# Patient Record
Sex: Female | Born: 1965 | Race: White | Hispanic: No | Smoking: Former smoker
Health system: Southern US, Community
[De-identification: ages and names within clinical notes are randomized; demographics above are authoritative.]

## PROBLEM LIST (undated history)

## (undated) DIAGNOSIS — D649 Anemia, unspecified: Secondary | ICD-10-CM

## (undated) DIAGNOSIS — E049 Nontoxic goiter, unspecified: Secondary | ICD-10-CM

## (undated) DIAGNOSIS — C55 Malignant neoplasm of uterus, part unspecified: Secondary | ICD-10-CM

## (undated) DIAGNOSIS — D696 Thrombocytopenia, unspecified: Secondary | ICD-10-CM

## (undated) HISTORY — PX: CHOLECYSTECTOMY: SHX55

## (undated) HISTORY — DX: Malignant neoplasm of uterus, part unspecified: C55

---

## 2000-08-03 ENCOUNTER — Emergency Department (HOSPITAL_COMMUNITY): Admission: EM | Admit: 2000-08-03 | Discharge: 2000-08-03 | Payer: Self-pay | Admitting: Emergency Medicine

## 2000-08-25 ENCOUNTER — Observation Stay (HOSPITAL_COMMUNITY): Admission: RE | Admit: 2000-08-25 | Discharge: 2000-08-26 | Payer: Self-pay | Admitting: *Deleted

## 2001-05-31 ENCOUNTER — Other Ambulatory Visit: Admission: RE | Admit: 2001-05-31 | Discharge: 2001-05-31 | Payer: Self-pay | Admitting: Obstetrics and Gynecology

## 2001-06-03 ENCOUNTER — Encounter: Admission: RE | Admit: 2001-06-03 | Discharge: 2001-06-03 | Payer: Self-pay | Admitting: Obstetrics and Gynecology

## 2001-06-03 ENCOUNTER — Encounter: Payer: Self-pay | Admitting: Obstetrics and Gynecology

## 2003-04-01 ENCOUNTER — Emergency Department (HOSPITAL_COMMUNITY): Admission: EM | Admit: 2003-04-01 | Discharge: 2003-04-01 | Payer: Self-pay | Admitting: Emergency Medicine

## 2013-07-20 ENCOUNTER — Encounter (HOSPITAL_COMMUNITY): Payer: Self-pay | Admitting: Emergency Medicine

## 2013-07-20 ENCOUNTER — Emergency Department (INDEPENDENT_AMBULATORY_CARE_PROVIDER_SITE_OTHER)
Admission: EM | Admit: 2013-07-20 | Discharge: 2013-07-20 | Disposition: A | Discharge status: Home / Self Care | Payer: Self-pay | Source: Home / Self Care | Attending: Emergency Medicine | Admitting: Emergency Medicine

## 2013-07-20 ENCOUNTER — Emergency Department (INDEPENDENT_AMBULATORY_CARE_PROVIDER_SITE_OTHER): Payer: Self-pay

## 2013-07-20 DIAGNOSIS — J209 Acute bronchitis, unspecified: Secondary | ICD-10-CM

## 2013-07-20 DIAGNOSIS — E049 Nontoxic goiter, unspecified: Secondary | ICD-10-CM

## 2013-07-20 DIAGNOSIS — J019 Acute sinusitis, unspecified: Secondary | ICD-10-CM

## 2013-07-20 MED ORDER — BENZONATATE 200 MG PO CAPS: 200.0000 mg | ORAL_CAPSULE | Freq: Three times a day (TID) | ORAL | Status: DC | PRN

## 2013-07-20 MED ORDER — IPRATROPIUM BROMIDE 0.06 % NA SOLN: 2.0000 | Freq: Four times a day (QID) | NASAL | Status: DC

## 2013-07-20 MED ORDER — AZITHROMYCIN 250 MG PO TABS: ORAL_TABLET | ORAL | Status: DC

## 2013-07-20 NOTE — ED Provider Notes (Signed)
Chief Complaint   Chief Complaint  Patient presents with  . URI    History of Present Illness   Sharon Hensley is a 48 year old female has had a one-week history of a cough productive of brown to green sputum, wheezing, chest pain, nasal congestion with greenish drainage, headache, sinus pressure, and ear pressure. She's also had poor appetite, fatigue, has to sleep sitting up, had subjective fever, stomach cramps, and constipation. She denies any sick exposures.  Review of Systems   Other than as noted above, the patient denies any of the following symptoms: Systemic:  No fevers, chills, sweats, or myalgias. Eye:  No redness or discharge. ENT:  No ear pain, headache, nasal congestion, drainage, sinus pressure, or sore throat. Neck:  No neck pain, stiffness, or swollen glands. Lungs:  No cough, sputum production, hemoptysis, wheezing, chest tightness, shortness of breath or chest pain. GI:  No abdominal pain, nausea, vomiting or diarrhea.  [name]   Past medical history, family history, social history, meds, and allergies were reviewed. She is allergic to penicillins.  Physical exam   Vital signs:  LMP 06/19/2013 General:  Alert and oriented.  In no distress.  Skin warm and dry. Eye:  No conjunctival injection or drainage. Lids were normal. ENT:  TMs and canals were normal, without erythema or inflammation.  Nasal mucosa was clear and uncongested, without drainage.  Mucous membranes were moist.  Pharynx was clear with no exudate or drainage.  There were no oral ulcerations or lesions. Neck:  Supple, no adenopathy, tenderness or mass. Her thyroid was diffusely enlarged. Lungs:  No respiratory distress.  Lungs were clear to auscultation, without wheezes, rales or rhonchi.  Breath sounds were clear and equal bilaterally.  Heart:  Regular rhythm, without gallops, murmers or rubs. Skin:  Clear, warm, and dry, without rash or lesions.  Radiology   Dg Chest 2 View  07/20/2013   CLINICAL  DATA:  Productive cough; shortness of breath  EXAM: CHEST  2 VIEW  COMPARISON:  None.  FINDINGS: There is no edema or consolidation. The heart size and pulmonary vascularity are normal. No adenopathy. No bone lesions.  IMPRESSION: No edema or consolidation.   Electronically Signed   By: Lowella Grip M.D.   On: 07/20/2013 12:46   Assessment     The primary encounter diagnosis was Acute sinusitis. Diagnoses of Acute bronchitis and Goiter were also pertinent to this visit.  She'll need followup on the goiter.  Plan    1.  Meds:  The following meds were prescribed:   Discharge Medication List as of 07/20/2013  1:14 PM    START taking these medications   Details  azithromycin (ZITHROMAX Z-PAK) 250 MG tablet Take as directed., Normal    benzonatate (TESSALON) 200 MG capsule Take 1 capsule (200 mg total) by mouth 3 (three) times daily as needed for cough., Starting 07/20/2013, Until Discontinued, Normal    ipratropium (ATROVENT) 0.06 % nasal spray Place 2 sprays into both nostrils 4 (four) times daily., Starting 07/20/2013, Until Discontinued, Normal        2.  Patient Education/Counseling:  The patient was given appropriate handouts, self care instructions, and instructed in symptomatic relief.  Instructed to get extra fluids, rest, and use a cool mist vaporizer.    3.  Follow up:  The patient was told to follow up here if no better in 3 to 4 days, or sooner if becoming worse in any way, and given some red flag symptoms such as increasing  fever, difficulty breathing, chest pain, or persistent vomiting which would prompt immediate return.  Follow up with Dr. Delrae Rend for the goiter.      Harden Mo, MD 07/20/13 2128

## 2013-07-20 NOTE — ED Notes (Signed)
C/o  Sob.  Chest congestion and soreness.  Productive cough with brown/yellow sputum.  Cough is worse at night.   Post nasal drip.   Fatigue.  States falling alseep at inappropriate times.   Symptoms present x 1 wk.    Denies fever and diarrhea.   No relief with otc meds.

## 2013-07-20 NOTE — Discharge Instructions (Signed)
Acute Bronchitis Bronchitis is inflammation of the airways that extend from the windpipe into the lungs (bronchi). The inflammation often causes mucus to develop. This leads to a cough, which is the most common symptom of bronchitis.  In acute bronchitis, the condition usually develops suddenly and goes away over time, usually in a couple weeks. Smoking, allergies, and asthma can make bronchitis worse. Repeated episodes of bronchitis may cause further lung problems.  CAUSES Acute bronchitis is most often caused by the same virus that causes a cold. The virus can spread from person to person (contagious).  SIGNS AND SYMPTOMS   Cough.   Fever.   Coughing up mucus.   Body aches.   Chest congestion.   Chills.   Shortness of breath.   Sore throat.  DIAGNOSIS  Acute bronchitis is usually diagnosed through a physical exam. Tests, such as chest X-rays, are sometimes done to rule out other conditions.  TREATMENT  Acute bronchitis usually goes away in a couple weeks. Often times, no medical treatment is necessary. Medicines are sometimes given for relief of fever or cough. Antibiotics are usually not needed but may be prescribed in certain situations. In some cases, an inhaler may be recommended to help reduce shortness of breath and control the cough. A cool mist vaporizer may also be used to help thin bronchial secretions and make it easier to clear the chest.  HOME CARE INSTRUCTIONS  Get plenty of rest.   Drink enough fluids to keep your urine clear or pale yellow (unless you have a medical condition that requires fluid restriction). Increasing fluids may help thin your secretions and will prevent dehydration.   Only take over-the-counter or prescription medicines as directed by your health care provider.   Avoid smoking and secondhand smoke. Exposure to cigarette smoke or irritating chemicals will make bronchitis worse. If you are a smoker, consider using nicotine gum or skin  patches to help control withdrawal symptoms. Quitting smoking will help your lungs heal faster.   Reduce the chances of another bout of acute bronchitis by washing your hands frequently, avoiding people with cold symptoms, and trying not to touch your hands to your mouth, nose, or eyes.   Follow up with your health care provider as directed.  SEEK MEDICAL CARE IF: Your symptoms do not improve after 1 week of treatment.  SEEK IMMEDIATE MEDICAL CARE IF:  You develop an increased fever or chills.   You have chest pain.   You have severe shortness of breath.  You have bloody sputum.   You develop dehydration.  You develop fainting.  You develop repeated vomiting.  You develop a severe headache. MAKE SURE YOU:   Understand these instructions.  Will watch your condition.  Will get help right away if you are not doing well or get worse. Document Released: 04/24/2004 Document Revised: 11/17/2012 Document Reviewed: 09/07/2012 [facility] Patient Information 2014 [place].  Goiter Goiter is an enlarged thyroid gland. The thyroid gland sits at the base of the front of the neck. The gland produces hormones that regulate mood, body temperature, pulse rate, and digestion. Most goiters are painless and are not a cause for serious concern. Goiters and conditions that cause goiters can be treated if necessary.  CAUSES  Common causes of goiter include:  Graves disease (causes too much hormone to be produced [hyperthyroidism]).  Hashimoto's disease (causes too little hormone to be produced [hypothyroidism]).  Thyroiditis (inflammation of the thyroid sometimes caused by virus or pregnancy).  Nodular goiter (small bumps  form; sometimes called toxic nodular goiter).  Pregnancy.  Thyroid cancer (very few goiters with nodules are cancerous).  Certain medications.  Radiation exposure.  Iodine deficiency (more common in developing countries in inland populations). RISK  FACTORS Risk factors for goiter include:  A family history of goiter.  Female gender.  Inadequate iodine in the diet.  Age older than 24 years. SYMPTOMS  Many goiters do not cause symptoms. When symptoms do occur, they may include:  Swelling in the lower part of the neck. This swelling can range from a very small bump to a large lump.  A tight feeling in the throat.  A hoarse voice. Less commonly, a goiter may result in:  Coughing.  Wheezing.  Difficulty swallowing.  Difficulty breathing.  Bulging neck veins.  Dizziness. When a goiter is the result of hyperthyroidism, symptoms may include:  Rapid or irregular heart beat.  Sicknessin your stomach (nausea).  Vomiting.  Diarrhea.  Shaking.  Irritable feeling.  Bulging eyes.  Weight loss.  Heat sensitivity.  Anxiety. When a goiter is the result of hypothyroidism, symptoms may include:  Tiredness.  Dry skin.  Constipation.  Weight gain.  Irregular menstrual cycle.  Depressed mood.  Sensitivity to cold. DIAGNOSIS  Tests used to diagnose goiter include:  A physical exam.  Blood tests, including thyroid hormone levels and antibody testing.  Ultrasonography, computerized X-ray scan (computed tomography, CT) or computerized magnetic scan (magnetic resonance imaging, MRI).  Thyroid scan (imaging along with safe radioactive injection).  Tissue sample taken (biopsy) of nodules. This is sometimes done to confirm that the nodules are not cancerous. TREATMENT  Treatment will depend on the cause of the goiter. Treatment may include:  Monitoring. In some cases, no treatment is necessary, and your doctor will monitor yourcondition at regular check ups.  Medications and supplements. Thyroid medication (thyroid hormone replacement) is available for hyperthroidism and hypothyroidism.  If inflammation is the cause, over-the-counter medication or steroid medication may be recommended.  Goiters caused by  iodine deficiency can be treated with iodine supplements or changes in diet.  Radioactive iodine treatment. Radioactive iodine is injected into the blood. It travels to the thyroid gland, kills thyroid cells, and reduces the size of the gland. This is only used when the thyroid gland is overactive. Lifelong thyroid hormone medication is often necessary after this treatment.  Surgery. A procedure to remove all or part of the gland may be recommended in severe cases or when cancer is the cause. Hormones can be taken to replace the hormones normally produced by the thyroid. HOME CARE INSTRUCTIONS   Take medications as directed.  Follow your caregiver's recommendations for any dietary changes.  Follow up with your caregiver for further examination and testing, as directed. PREVENTION   If you have a family history of goiter, discuss screening with your doctor.  Make sure you are getting enough iodine in your diet.  Use of iodized table salt can help prevent iodine deficiency. Document Released: 09/04/2009 Document Revised: 06/09/2011 Document Reviewed: 09/04/2009 [facility] Patient Information 2014 [place].  Sinusitis Sinusitis is redness, soreness, and swelling (inflammation) of the paranasal sinuses. Paranasal sinuses are air pockets within the bones of your face (beneath the eyes, the middle of the forehead, or above the eyes). In healthy paranasal sinuses, mucus is able to drain out, and air is able to circulate through them by way of your nose. However, when your paranasal sinuses are inflamed, mucus and air can become trapped. This can allow bacteria and other  germs to grow and cause infection. Sinusitis can develop quickly and last only a short time (acute) or continue over a long period (chronic). Sinusitis that lasts for more than 12 weeks is considered chronic.  CAUSES  Causes of sinusitis include:  Allergies.  Structural abnormalities, such as displacement of the cartilage  that separates your nostrils (deviated septum), which can decrease the air flow through your nose and sinuses and affect sinus drainage.  Functional abnormalities, such as when the small hairs (cilia) that line your sinuses and help remove mucus do not work properly or are not present. SYMPTOMS  Symptoms of acute and chronic sinusitis are the same. The primary symptoms are pain and pressure around the affected sinuses. Other symptoms include:  Upper toothache.  Earache.  Headache.  Bad breath.  Decreased sense of smell and taste.  A cough, which worsens when you are lying flat.  Fatigue.  Fever.  Thick drainage from your nose, which often is green and may contain pus (purulent).  Swelling and warmth over the affected sinuses. DIAGNOSIS  Your caregiver will perform a physical exam. During the exam, your caregiver may:  Look in your nose for signs of abnormal growths in your nostrils (nasal polyps).  Tap over the affected sinus to check for signs of infection.  View the inside of your sinuses (endoscopy) with a special imaging device with a light attached (endoscope), which is inserted into your sinuses. If your caregiver suspects that you have chronic sinusitis, one or more of the following tests may be recommended:  Allergy tests.  Nasal culture A sample of mucus is taken from your nose and sent to a lab and screened for bacteria.  Nasal cytology A sample of mucus is taken from your nose and examined by your caregiver to determine if your sinusitis is related to an allergy. TREATMENT  Most cases of acute sinusitis are related to a viral infection and will resolve on their own within 10 days. Sometimes medicines are prescribed to help relieve symptoms (pain medicine, decongestants, nasal steroid sprays, or saline sprays).  However, for sinusitis related to a bacterial infection, your caregiver will prescribe antibiotic medicines. These are medicines that will help kill the  bacteria causing the infection.  Rarely, sinusitis is caused by a fungal infection. In theses cases, your caregiver will prescribe antifungal medicine. For some cases of chronic sinusitis, surgery is needed. Generally, these are cases in which sinusitis recurs more than 3 times per year, despite other treatments. HOME CARE INSTRUCTIONS   Drink plenty of water. Water helps thin the mucus so your sinuses can drain more easily.  Use a humidifier.  Inhale steam 3 to 4 times a day (for example, sit in the bathroom with the shower running).  Apply a warm, moist washcloth to your face 3 to 4 times a day, or as directed by your caregiver.  Use saline nasal sprays to help moisten and clean your sinuses.  Take over-the-counter or prescription medicines for pain, discomfort, or fever only as directed by your caregiver. SEEK IMMEDIATE MEDICAL CARE IF:  You have increasing pain or severe headaches.  You have nausea, vomiting, or drowsiness.  You have swelling around your face.  You have vision problems.  You have a stiff neck.  You have difficulty breathing. MAKE SURE YOU:   Understand these instructions.  Will watch your condition.  Will get help right away if you are not doing well or get worse. Document Released: 03/17/2005 Document Revised: 06/09/2011 Document Reviewed:  04/01/2011 [employer] Patient Information 2014 [place].

## 2013-07-27 NOTE — ED Notes (Signed)
Chart review.

## 2013-08-08 ENCOUNTER — Encounter: Payer: Self-pay | Admitting: Internal Medicine

## 2013-08-12 ENCOUNTER — Emergency Department (HOSPITAL_COMMUNITY): Payer: Self-pay

## 2013-08-12 ENCOUNTER — Encounter (HOSPITAL_COMMUNITY): Payer: Self-pay | Admitting: Emergency Medicine

## 2013-08-12 ENCOUNTER — Emergency Department (HOSPITAL_COMMUNITY)
Admission: EM | Admit: 2013-08-12 | Discharge: 2013-08-12 | Disposition: A | Discharge status: Home / Self Care | Payer: Self-pay | Attending: Emergency Medicine | Admitting: Emergency Medicine

## 2013-08-12 DIAGNOSIS — R609 Edema, unspecified: Secondary | ICD-10-CM | POA: Insufficient documentation

## 2013-08-12 DIAGNOSIS — D649 Anemia, unspecified: Secondary | ICD-10-CM | POA: Insufficient documentation

## 2013-08-12 DIAGNOSIS — J039 Acute tonsillitis, unspecified: Secondary | ICD-10-CM

## 2013-08-12 DIAGNOSIS — H6690 Otitis media, unspecified, unspecified ear: Secondary | ICD-10-CM

## 2013-08-12 DIAGNOSIS — Z87891 Personal history of nicotine dependence: Secondary | ICD-10-CM | POA: Insufficient documentation

## 2013-08-12 DIAGNOSIS — IMO0002 Reserved for concepts with insufficient information to code with codable children: Secondary | ICD-10-CM | POA: Insufficient documentation

## 2013-08-12 DIAGNOSIS — Z880 Allergy status to penicillin: Secondary | ICD-10-CM | POA: Insufficient documentation

## 2013-08-12 DIAGNOSIS — Z79899 Other long term (current) drug therapy: Secondary | ICD-10-CM | POA: Insufficient documentation

## 2013-08-12 DIAGNOSIS — Z791 Long term (current) use of non-steroidal anti-inflammatories (NSAID): Secondary | ICD-10-CM | POA: Insufficient documentation

## 2013-08-12 DIAGNOSIS — E049 Nontoxic goiter, unspecified: Secondary | ICD-10-CM | POA: Insufficient documentation

## 2013-08-12 DIAGNOSIS — Z792 Long term (current) use of antibiotics: Secondary | ICD-10-CM | POA: Insufficient documentation

## 2013-08-12 HISTORY — DX: Nontoxic goiter, unspecified: E04.9

## 2013-08-12 HISTORY — DX: Anemia, unspecified: D64.9

## 2013-08-12 HISTORY — DX: Thrombocytopenia, unspecified: D69.6

## 2013-08-12 LAB — COMPREHENSIVE METABOLIC PANEL
ALT: 61 U/L — ABNORMAL HIGH (ref 0–35)
AST: 94 U/L — AB (ref 0–37)
Albumin: 4.4 g/dL (ref 3.5–5.2)
Alkaline Phosphatase: 71 U/L (ref 39–117)
BILIRUBIN TOTAL: 0.2 mg/dL — AB (ref 0.3–1.2)
BUN: 9 mg/dL (ref 6–23)
CHLORIDE: 94 meq/L — AB (ref 96–112)
CO2: 31 mEq/L (ref 19–32)
CREATININE: 1.01 mg/dL (ref 0.50–1.10)
Calcium: 11.3 mg/dL — ABNORMAL HIGH (ref 8.4–10.5)
GFR calc Af Amer: 76 mL/min — ABNORMAL LOW (ref >90)
GFR calc non Af Amer: 65 mL/min — ABNORMAL LOW (ref >90)
Glucose, Bld: 90 mg/dL (ref 70–99)
Potassium: 3.7 mEq/L (ref 3.7–5.3)
Sodium: 137 mEq/L (ref 137–147)
Total Protein: 8.4 g/dL — ABNORMAL HIGH (ref 6.0–8.3)

## 2013-08-12 LAB — CBC WITH DIFFERENTIAL/PLATELET
Basophils Absolute: 0 10*3/uL (ref 0.0–0.1)
Basophils Relative: 0 % (ref 0–1)
EOS ABS: 0.1 10*3/uL (ref 0.0–0.7)
Eosinophils Relative: 1 % (ref 0–5)
HEMATOCRIT: 32.1 % — AB (ref 36.0–46.0)
Hemoglobin: 8.8 g/dL — ABNORMAL LOW (ref 12.0–15.0)
LYMPHS PCT: 21 % (ref 12–46)
Lymphs Abs: 2.3 10*3/uL (ref 0.7–4.0)
MCH: 19.6 pg — AB (ref 26.0–34.0)
MCHC: 27.4 g/dL — AB (ref 30.0–36.0)
MCV: 71.5 fL — ABNORMAL LOW (ref 78.0–100.0)
Monocytes Absolute: 0.5 10*3/uL (ref 0.1–1.0)
Monocytes Relative: 5 % (ref 3–12)
NEUTROS ABS: 7.9 10*3/uL — AB (ref 1.7–7.7)
Neutrophils Relative %: 73 % (ref 43–77)
Platelets: 308 10*3/uL (ref 150–400)
RBC: 4.49 MIL/uL (ref 3.87–5.11)
RDW: 30.8 % — ABNORMAL HIGH (ref 11.5–15.5)
WBC: 10.8 10*3/uL — ABNORMAL HIGH (ref 4.0–10.5)

## 2013-08-12 LAB — I-STAT CHEM 8, ED
BUN: 7 mg/dL (ref 6–23)
CALCIUM ION: 1.35 mmol/L — AB (ref 1.12–1.23)
CREATININE: 1.1 mg/dL (ref 0.50–1.10)
Chloride: 95 mEq/L — ABNORMAL LOW (ref 96–112)
GLUCOSE: 91 mg/dL (ref 70–99)
HEMATOCRIT: 35 % — AB (ref 36.0–46.0)
HEMOGLOBIN: 11.9 g/dL — AB (ref 12.0–15.0)
POTASSIUM: 3.4 meq/L — AB (ref 3.7–5.3)
Sodium: 136 mEq/L — ABNORMAL LOW (ref 137–147)
TCO2: 31 mmol/L (ref 0–100)

## 2013-08-12 LAB — RAPID STREP SCREEN (MED CTR MEBANE ONLY): Streptococcus, Group A Screen (Direct): NEGATIVE

## 2013-08-12 MED ORDER — IOHEXOL 300 MG/ML  SOLN
80.0000 mL | Freq: Once | INTRAMUSCULAR | Status: AC | PRN
  Administered 2013-08-12: 80 mL via INTRAVENOUS

## 2013-08-12 MED ORDER — DEXAMETHASONE SODIUM PHOSPHATE 10 MG/ML IJ SOLN
10.0000 mg | Freq: Once | INTRAMUSCULAR | Status: AC
  Administered 2013-08-12: 10 mg via INTRAVENOUS
  Filled 2013-08-12: qty 1

## 2013-08-12 MED ORDER — SODIUM CHLORIDE 0.9 % IV BOLUS (SEPSIS)
500.0000 mL | Freq: Once | INTRAVENOUS | Status: AC
  Administered 2013-08-12: 500 mL via INTRAVENOUS

## 2013-08-12 MED ORDER — CLINDAMYCIN HCL 150 MG PO CAPS: 450.0000 mg | ORAL_CAPSULE | Freq: Three times a day (TID) | ORAL | Status: DC

## 2013-08-12 NOTE — ED Provider Notes (Signed)
CSN: [id number]     Arrival date & time 08/12/13  2023 History   First MD Initiated Contact with Patient 08/12/13 2037     Chief Complaint  Patient presents with  . Respiratory Distress     (Consider location/radiation/quality/duration/timing/severity/associated sxs/prior Treatment) The history is provided by the patient.   patient presents with difficulty breathing and swallowing she has had problems for last 3 weeks. She's been treated with antibiotics for sinusitis having had azithromycin and doxycycline. She recently finished doxycycline. Today she began to have more trouble breathing with a raspy voice. She states she's been having some food getting caught for a while. She states she was diagnosed with a goiter is supposed to have endocrinology followup. No fevers. She's had her ears plugged up also. No coughing. She is able swallow her secretions. She states she feels as if food gets caught in her throat. The breathing got worse today   Past Medical History  Diagnosis Date  . Goiter   . Anemia   . Temporary low platelet count    Past Surgical History  Procedure Laterality Date  . Cholecystectomy     No family history on file. History  Substance Use Topics  . Smoking status: Former Research scientist (life sciences)  . Smokeless tobacco: Not on file  . Alcohol Use: No   OB History   Grav Para Term Preterm Abortions TAB SAB Ect Mult Living                 Review of Systems  Constitutional: Negative for activity change and appetite change.  HENT: Positive for ear pain, sore throat, trouble swallowing and voice change. Negative for facial swelling.   Eyes: Negative for pain.  Respiratory: Positive for shortness of breath. Negative for cough and chest tightness.   Cardiovascular: Negative for chest pain and leg swelling.  Gastrointestinal: Negative for nausea, vomiting, abdominal pain and diarrhea.  Genitourinary: Negative for flank pain.  Musculoskeletal: Negative for back pain and neck stiffness.   Skin: Negative for rash.  Neurological: Negative for weakness, numbness and headaches.  Psychiatric/Behavioral: Negative for behavioral problems.      Allergies  Penicillins and Veetids  Home Medications   Prior to Admission medications   Medication Sig Start Date End Date Taking? Authorizing Provider  Acetaminophen-Caff-Pyrilamine 545-62-56 MG TABS Take 2 tablets by mouth once as needed (menstrul pain).    Yes Historical Provider, MD  benzonatate (TESSALON) 200 MG capsule Take 1 capsule (200 mg total) by mouth 3 (three) times daily as needed for cough. 07/20/13  Yes Harden Mo, MD  butalbital-acetaminophen-caffeine (FIORICET, ESGIC) [phone] MG per tablet Take 1 tablet by mouth 4 (four) times daily as needed (neck/ head pain).   Yes Historical Provider, MD  caffeine 200 MG TABS tablet Take 200 mg by mouth every 4 (four) hours as needed.   Yes Historical Provider, MD  COCONUT OIL PO Take 1 capsule by mouth daily.   Yes Historical Provider, MD  famotidine-calcium carbonate-magnesium hydroxide (PEPCID COMPLETE) [phone] MG CHEW chewable tablet Chew 1 tablet by mouth at bedtime as needed (heartburn).   Yes Historical Provider, MD  ferrous sulfate 325 (65 FE) MG tablet Take 325 mg by mouth 3 (three) times daily with meals.    Yes Historical Provider, MD  fluticasone (FLONASE) 50 MCG/ACT nasal spray Place 1 spray into both nostrils daily.    Yes Historical Provider, MD  ibuprofen (ADVIL,MOTRIN) 200 MG tablet Take 400-800 mg by mouth every 6 (six) hours as needed for  moderate pain.    Yes Historical Provider, MD  ipratropium (ATROVENT) 0.06 % nasal spray Place 2 sprays into both nostrils 4 (four) times daily. 07/20/13  Yes Harden Mo, MD  Multiple Vitamin (MULTIVITAMIN WITH MINERALS) TABS tablet Take 1 tablet by mouth daily.   Yes Historical Provider, MD  omega-3 acid ethyl esters (LOVAZA) 1 G capsule Take 1 g by mouth daily.    Yes Historical Provider, MD  omeprazole (PRILOSEC) 20 MG  capsule Take 40 mg by mouth daily with breakfast.   Yes Historical Provider, MD  sodium chloride (OCEAN) 0.65 % SOLN nasal spray Place 1 spray into both nostrils as needed for congestion.   Yes Historical Provider, MD  doxycycline (VIBRA-TABS) 100 MG tablet Take 100 mg by mouth daily.    Historical Provider, MD   BP 126/66  Pulse 83  Temp(Src) 97.6 F (36.4 C) (Oral)  Resp 16  Ht 5\' 5"  (1.651 m)  Wt 226 lb (102.513 kg)  BMI 37.61 kg/m2  SpO2 98%  LMP 07/27/2013 Physical Exam  Constitutional: She is oriented to person, place, and time. She appears well-developed and well-nourished.  HENT:  Head: Normocephalic.  White effusions behind bilateral TMs.  mild posterior pharyngeal erythema, but some bilateral swelling of tonsils. Somewhat harsh voice.   Eyes: Pupils are equal, round, and reactive to light.  Neck: Normal range of motion. Neck supple. No tracheal deviation present. Thyromegaly present.  Cardiovascular: Normal rate and regular rhythm.   Pulmonary/Chest: Effort normal.  Transmitted upper airway sounds. No respiratory distress  Abdominal: There is no tenderness.  Musculoskeletal: She exhibits edema.  Mild bilateral lower extremity pitting edema  Lymphadenopathy:    She has cervical adenopathy.  Neurological: She is alert and oriented to person, place, and time.  Skin: Skin is warm. No erythema.    ED Course  Procedures (including critical care time) Labs Review Labs Reviewed  CBC WITH DIFFERENTIAL - Abnormal; Notable for the following:    WBC 10.8 (*)    Hemoglobin 8.8 (*)    HCT 32.1 (*)    MCV 71.5 (*)    MCH 19.6 (*)    MCHC 27.4 (*)    RDW 30.8 (*)    Neutro Abs 7.9 (*)    All other components within normal limits  COMPREHENSIVE METABOLIC PANEL - Abnormal; Notable for the following:    Chloride 94 (*)    Calcium 11.3 (*)    Total Protein 8.4 (*)    AST 94 (*)    ALT 61 (*)    Total Bilirubin 0.2 (*)    GFR calc non Af Amer 65 (*)    GFR calc Af Amer 76  (*)    All other components within normal limits  I-STAT CHEM 8, ED - Abnormal; Notable for the following:    Sodium 136 (*)    Potassium 3.4 (*)    Chloride 95 (*)    Calcium, Ion 1.35 (*)    Hemoglobin 11.9 (*)    HCT 35.0 (*)    All other components within normal limits  RAPID STREP SCREEN  TSH  T3, FREE  T4, FREE    Imaging Review Ct Soft Tissue Neck W Contrast  08/12/2013   CLINICAL DATA:  Difficulty breathing, difficulty swallowing. History of goiter.  EXAM: CT NECK WITH CONTRAST  TECHNIQUE: Multidetector CT imaging of the neck was performed using the standard protocol following the bolus administration of intravenous contrast.  CONTRAST:  38mL OMNIPAQUE IOHEXOL 300 MG/ML  SOLN  COMPARISON:  None.  FINDINGS: Mild motion degraded examination, symmetric enlargement of the palatine tonsils, and retropharyngeal soft tissues. Preservation parapharyngeal fat tissue planes. Mild effacement of the left piriform sinus via low-density possible fluid/ secretions. Larynx is unremarkable.  Normal appearance of the major salivary glands. Thyromegaly, thyroid is at least 6.8 cm in craniocaudad dimension. No dominant nodule. There free fluid or fluid collections within the neck. No lymphadenopathy by CT size criteria. Retropharyngeal course of left carotid artery may be transient, normal variant  Straightened cervical lordosis with moderate C5-6, mild to moderate C6-7 degenerative disc disease. Bilateral middle ear and mastoid effusions. Visualized paranasal sinuses are well aerated. Lung apices are clear.  IMPRESSION: Prominent palatine tonsils and adenoidal soft tissues, is unclear this reflects acute process, recommend correlation with immune status. Bilateral middle ear and mastoid effusions may be postobstructive. In addition, fluid/secretions effacing the left piriform sinus. For constellation of findings, recommend direct inspection. Patent airway.  Thyromegaly, without mass effect on the trachea.    Electronically Signed   By: Elon Alas   On: 08/12/2013 22:15     EKG Interpretation None      MDM   Final diagnoses:  Tonsillitis  Otitis media    Patient with some fullness in her mouth. Patient was convinced it was her goiter, however appears more that is tonsils and adenoids that are swollen. Also has a year and mastoid effusions. Will treat with some steroids for the swelling. We'll start antibiotics. Will have follow with ENT. Does not appear to be impending airway compromise at this time.    Jasper Riling. Alvino Chapel, MD 08/12/13 2320

## 2013-08-12 NOTE — ED Notes (Signed)
Initial contact - pt resting on stretcher with family at bs.  Dr. Alvino Chapel at bs for eval.  Pt reports dx with goiter recently, but acutely SOB, difficulty swallowing and visible throat swelling noted.  Muffled voice, thick speech.  RR mildly labored, pt visibly winded with speaking.  Mild difficulty clearing secretions, but able.  Skin pale.  MAEI.  Changed to hospital gown, placed to cardiac/02 monitor.

## 2013-08-12 NOTE — Discharge Instructions (Signed)
Otitis Media, Adult Otitis media is redness, soreness, and swelling (inflammation) of the middle ear. Otitis media may be caused by allergies or, most commonly, by infection. Often it occurs as a complication of the common cold. SIGNS AND SYMPTOMS Symptoms of otitis media may include:  Earache.  Fever.  Ringing in your ear.  Headache.  Leakage of fluid from the ear. DIAGNOSIS To diagnose otitis media, your health care provider will examine your ear with an otoscope. This is an instrument that allows your health care provider to see into your ear in order to examine your eardrum. Your health care provider also will ask you questions about your symptoms. TREATMENT  Typically, otitis media resolves on its own within 3 5 days. Your health care provider may prescribe medicine to ease your symptoms of pain. If otitis media does not resolve within 5 days or is recurrent, your health care provider may prescribe antibiotic medicines if he or she suspects that a bacterial infection is the cause. HOME CARE INSTRUCTIONS   Take your medicine as directed until it is gone, even if you feel better after the first few days.  Only take over-the-counter or prescription medicines for pain, discomfort, or fever as directed by your health care provider.  Follow up with your health care provider as directed. SEEK MEDICAL CARE IF:  You have otitis media only in one ear or bleeding from your nose or both.  You notice a lump on your neck.  You are not getting better in 3 5 days.  You feel worse instead of better. SEEK IMMEDIATE MEDICAL CARE IF:   You have pain that is not controlled with medicine.  You have swelling, redness, or pain around your ear or stiffness in your neck.  You notice that part of your face is paralyzed.  You notice that the bone behind your ear (mastoid) is tender when you touch it. MAKE SURE YOU:   Understand these instructions.  Will watch your condition.  Will get help  right away if you are not doing well or get worse. Document Released: 12/21/2003 Document Revised: 01/05/2013 Document Reviewed: 10/12/2012 [facility] Patient Information 2014 [place], [place].  Tonsillitis Tonsillitis is an infection of the throat that causes the tonsils to become red, tender, and swollen. Tonsils are collections of lymphoid tissue at the back of the throat. Each tonsil has crevices (crypts). Tonsils help fight nose and throat infections and keep infection from spreading to other parts of the body for the first 18 months of life.  CAUSES Sudden (acute) tonsillitis is usually caused by infection with streptococcal bacteria. Long-lasting (chronic) tonsillitis occurs when the crypts of the tonsils become filled with pieces of food and bacteria, which makes it easy for the tonsils to become repeatedly infected. SYMPTOMS  Symptoms of tonsillitis include:  A sore throat, with possible difficulty swallowing.  White patches on the tonsils.  Fever.  Tiredness.  New episodes of snoring during sleep, when you did not snore before.  Small, foul-smelling, yellowish-white pieces of material (tonsilloliths) that you occasionally cough up or spit out. The tonsilloliths can also cause you to have bad breath. DIAGNOSIS Tonsillitis can be diagnosed through a physical exam. Diagnosis can be confirmed with the results of lab tests, including a throat culture. TREATMENT  The goals of tonsillitis treatment include the reduction of the severity and duration of symptoms and prevention of associated conditions. Symptoms of tonsillitis can be improved with the use of steroids to reduce the swelling. Tonsillitis caused by bacteria  can be treated with antibiotics. Usually, treatment with antibiotics is started before the cause of the tonsillitis is known. However, if it is determined that the cause is not bacterial, antibiotics will not treat the tonsillitis. If attacks of tonsillitis are severe and  frequent, your caregiver may recommend surgery to remove the tonsils (tonsillectomy). HOME CARE INSTRUCTIONS   Rest as much as possible and get plenty of sleep.  Drink plenty of fluids. While the throat is very sore, eat soft foods or liquids, such as sherbet, soups, or instant breakfast drinks.  Eat frozen ice pops.  Gargle with a warm or cold liquid to help soothe the throat. Mix 1/4 teaspoon of salt and 1/4 teaspoon of baking soda in in 8 oz of water. SEEK MEDICAL CARE IF:   Large, tender lumps develop in your neck.  A rash develops.  A green, yellow-brown, or bloody substance is coughed up.  You are unable to swallow liquids or food for 24 hours.  You notice that only one of the tonsils is swollen. SEEK IMMEDIATE MEDICAL CARE IF:   You develop any new symptoms such as vomiting, severe headache, stiff neck, chest pain, or trouble breathing or swallowing.  You have severe throat pain along with drooling or voice changes.  You have severe pain, unrelieved with recommended medications.  You are unable to fully open the mouth.  You develop redness, swelling, or severe pain anywhere in the neck.  You have a fever. MAKE SURE YOU:   Understand these instructions.  Will watch your condition.  Will get help right away if you are not doing well or get worse. Document Released: 12/25/2004 Document Revised: 11/17/2012 Document Reviewed: 09/03/2012 [facility] Patient Information 2014 [place], [place].

## 2013-08-12 NOTE — ED Notes (Signed)
EKG given to EDP,Pickering,MD. For review. 

## 2013-08-12 NOTE — ED Notes (Signed)
Pt c/o difficulty breathing and difficulty swallowing, pt states today worse while working. Pt recently dx goiter, voice muffled.

## 2013-08-12 NOTE — ED Notes (Signed)
Pt to CT

## 2013-08-13 LAB — TSH

## 2013-08-13 LAB — T4, FREE: FREE T4: 0.25 ng/dL — AB (ref 0.80–1.80)

## 2013-08-13 LAB — T3, FREE

## 2013-08-14 ENCOUNTER — Encounter (HOSPITAL_COMMUNITY): Payer: Self-pay | Admitting: Emergency Medicine

## 2013-08-14 ENCOUNTER — Emergency Department (HOSPITAL_COMMUNITY)
Admission: EM | Admit: 2013-08-14 | Discharge: 2013-08-14 | Disposition: A | Discharge status: Home / Self Care | Payer: Self-pay | Attending: Emergency Medicine | Admitting: Emergency Medicine

## 2013-08-14 DIAGNOSIS — T50905A Adverse effect of unspecified drugs, medicaments and biological substances, initial encounter: Secondary | ICD-10-CM

## 2013-08-14 DIAGNOSIS — R443 Hallucinations, unspecified: Secondary | ICD-10-CM | POA: Insufficient documentation

## 2013-08-14 DIAGNOSIS — Z8709 Personal history of other diseases of the respiratory system: Secondary | ICD-10-CM | POA: Insufficient documentation

## 2013-08-14 DIAGNOSIS — T368X5A Adverse effect of other systemic antibiotics, initial encounter: Secondary | ICD-10-CM | POA: Insufficient documentation

## 2013-08-14 DIAGNOSIS — Z87891 Personal history of nicotine dependence: Secondary | ICD-10-CM | POA: Insufficient documentation

## 2013-08-14 DIAGNOSIS — Z8639 Personal history of other endocrine, nutritional and metabolic disease: Secondary | ICD-10-CM | POA: Insufficient documentation

## 2013-08-14 DIAGNOSIS — Z792 Long term (current) use of antibiotics: Secondary | ICD-10-CM | POA: Insufficient documentation

## 2013-08-14 DIAGNOSIS — Z79899 Other long term (current) drug therapy: Secondary | ICD-10-CM | POA: Insufficient documentation

## 2013-08-14 DIAGNOSIS — Z8669 Personal history of other diseases of the nervous system and sense organs: Secondary | ICD-10-CM | POA: Insufficient documentation

## 2013-08-14 DIAGNOSIS — D649 Anemia, unspecified: Secondary | ICD-10-CM | POA: Insufficient documentation

## 2013-08-14 DIAGNOSIS — IMO0002 Reserved for concepts with insufficient information to code with codable children: Secondary | ICD-10-CM | POA: Insufficient documentation

## 2013-08-14 DIAGNOSIS — Z862 Personal history of diseases of the blood and blood-forming organs and certain disorders involving the immune mechanism: Secondary | ICD-10-CM | POA: Insufficient documentation

## 2013-08-14 LAB — CULTURE, GROUP A STREP

## 2013-08-14 MED ORDER — AZITHROMYCIN 250 MG PO TABS: 250.0000 mg | ORAL_TABLET | Freq: Every day | ORAL | Status: DC

## 2013-08-14 NOTE — ED Provider Notes (Signed)
TIME SEEN: 12:57 PM  CHIEF COMPLAINT: Medication reaction  HPI: Patient is a 48 y.o. F with no significant past medical history who presents the emergency department with hallucinations which she relates to being on clindamycin. She was seen in the emergency department 2 days ago and diagnosed with otitis media and tonsillitis. She was given a dose of Decadron and started on clindamycin. She reports that one hour after taking clindamycin she will have hallucinations and has been having nightmares. She is concerned that the medication is "too strong for me". She denies any fevers, difficulty breathing or swallowing, difficulty moving her neck, neck pain or stiffness, vomiting or diarrhea, dysuria or hematuria.  ROS: See HPI Constitutional: no fever  Eyes: no drainage  ENT: no runny nose   Cardiovascular:  no chest pain  Resp: no SOB  GI: no vomiting GU: no dysuria Integumentary: no rash  Allergy: no hives  Musculoskeletal: no leg swelling  Neurological: no slurred speech ROS otherwise negative  PAST MEDICAL HISTORY/PAST SURGICAL HISTORY:  Past Medical History  Diagnosis Date  . Goiter   . Anemia   . Temporary low platelet count     MEDICATIONS:  Prior to Admission medications   Medication Sig Start Date End Date Taking? Authorizing Provider  Acetaminophen-Caff-Pyrilamine 601-09-32 MG TABS Take 2 tablets by mouth once as needed (menstrul pain).     Historical Provider, MD  benzonatate (TESSALON) 200 MG capsule Take 1 capsule (200 mg total) by mouth 3 (three) times daily as needed for cough. 07/20/13   Harden Mo, MD  butalbital-acetaminophen-caffeine (FIORICET, ESGIC) [phone] MG per tablet Take 1 tablet by mouth 4 (four) times daily as needed (neck/ head pain).    Historical Provider, MD  caffeine 200 MG TABS tablet Take 200 mg by mouth every 4 (four) hours as needed.    Historical Provider, MD  clindamycin (CLEOCIN) 150 MG capsule Take 3 capsules (450 mg total) by mouth 3  (three) times daily. 08/12/13   Jasper Riling. Pickering, MD  COCONUT OIL PO Take 1 capsule by mouth daily.    Historical Provider, MD  doxycycline (VIBRA-TABS) 100 MG tablet Take 100 mg by mouth daily.    Historical Provider, MD  famotidine-calcium carbonate-magnesium hydroxide (PEPCID COMPLETE) [phone] MG CHEW chewable tablet Chew 1 tablet by mouth at bedtime as needed (heartburn).    Historical Provider, MD  ferrous sulfate 325 (65 FE) MG tablet Take 325 mg by mouth 3 (three) times daily with meals.     Historical Provider, MD  fluticasone (FLONASE) 50 MCG/ACT nasal spray Place 1 spray into both nostrils daily.     Historical Provider, MD  ibuprofen (ADVIL,MOTRIN) 200 MG tablet Take 400-800 mg by mouth every 6 (six) hours as needed for moderate pain.     Historical Provider, MD  ipratropium (ATROVENT) 0.06 % nasal spray Place 2 sprays into both nostrils 4 (four) times daily. 07/20/13   Harden Mo, MD  Multiple Vitamin (MULTIVITAMIN WITH MINERALS) TABS tablet Take 1 tablet by mouth daily.    Historical Provider, MD  omega-3 acid ethyl esters (LOVAZA) 1 G capsule Take 1 g by mouth daily.     Historical Provider, MD  omeprazole (PRILOSEC) 20 MG capsule Take 40 mg by mouth daily with breakfast.    Historical Provider, MD  sodium chloride (OCEAN) 0.65 % SOLN nasal spray Place 1 spray into both nostrils as needed for congestion.    Historical Provider, MD    ALLERGIES:  Allergies  Allergen Reactions  .  Penicillins Hives and Itching  . Veetids [Penicillin V] Hives and Itching    SOCIAL HISTORY:  History  Substance Use Topics  . Smoking status: Former Research scientist (life sciences)  . Smokeless tobacco: Not on file  . Alcohol Use: No    FAMILY HISTORY: Family History  Problem Relation Age of Onset  . Diabetes Mother   . Diabetes Other     EXAM: BP 131/70  Pulse 91  Temp(Src) 98.2 F (36.8 C) (Oral)  Resp 20  SpO2 100%  LMP 07/27/2013 CONSTITUTIONAL: Alert and oriented x3 and responds appropriately to  questions. Well-appearing; well-nourished, pleasant, joking and laughing, well-hydrated, nontoxic HEAD: Normocephalic EYES: Conjunctivae clear, PERRL ENT: normal nose; no rhinorrhea; moist mucous membranes; bilateral tonsillar hypertrophy without exudate, no uvular deviation, no trismus or drooling, no dental lesions NECK: Supple, no meningismus, no LAD; full range of motion in her neck that is painless CARD: RRR; S1 and S2 appreciated; no murmurs, no clicks, no rubs, no gallops RESP: Normal chest excursion without splinting or tachypnea; breath sounds clear and equal bilaterally; no wheezes, no rhonchi, no rales,  ABD/GI: Normal bowel sounds; non-distended; soft, non-tender, no rebound, no guarding BACK:  The back appears normal and is non-tender to palpation, there is no CVA tenderness EXT: Normal ROM in all joints; non-tender to palpation; no edema; normal capillary refill; no cyanosis    SKIN: Normal color for age and race; warm NEURO: Moves all extremities equally, sensation to light touch intact diffusely, cranial nerves II through XII intact PSYCH: The patient's mood and manner are appropriate. Grooming and personal hygiene are appropriate.  MEDICAL DECISION MAKING: Patient here with medication reaction. She states she is having hallucinations and nightmares. Suspect this may be related to steroids that she was given 2 days ago which she feels is related to her clindamycin. She is requesting a different antibiotic. We'll change her to azithromycin and she states she's taken this in the past without difficulty. She states she is allergic to penicillin. She feels her tonsillitis is improving. She denies any other infectious symptoms. No urinary symptoms. No head injury. She is neurologically intact. Oriented x3 currently. Have discussed return precautions. Patient verbalizes understanding and is comfortable with this plan.   [place], DO 08/14/13 [phone]

## 2013-08-14 NOTE — ED Notes (Signed)
Pt discharged to home with partner. Both aware of specific discharge instructions. Instructed to return clindamycin tablets back to pharmacy for appropriate disposal. Explained AVS in detail. Pt knows to stop taking clindamycin and take azithromycin instead. Ambulatory with steady gait.

## 2013-08-14 NOTE — ED Notes (Addendum)
Pt /family reports that she hallucinations since yesterday. She is being treated for tonsillitis. She is concerned that it may be a reaction to her antibiotic. Pt was given a steroid injection Friday night. Yesterday, at 34- EMS responded to her call for shortness of breath. Treated at home with breathing treatment

## 2013-08-14 NOTE — ED Notes (Addendum)
Initial contact- Oriented to place, year, her DOB but disoriented to current day. Was seen here in [facility] ED on Friday and dx with otitis media and tonsillitis and prescribed clindamycin. Took first dose Saturday and has had 2 doses total. Per patient "I have been having nightmares and lucid dreams. They are scary and I've been waking up screaming and yelling." Per partner, patient was bitten by a tick a month ago and since then has been "babbling and talking out of her head." Pt feels abx is too strong and wishes to be prescribed "a different antibiotic or a weaker dose." Pt reports not sleeping for a few nights. Hx goiter, hypothyroidism- was [facility] on 4/[facility] for [facility] and was newly diagnosed by MD Jake Michaelis. Was given Zpac and had no problems with this med. Ambulatory. In NAD. Denies N, V, D, chills, itching, rash. No other complaints at this time. Waiting for MD.

## 2013-08-14 NOTE — Discharge Instructions (Signed)
You were seen in the emergency department for her hallucinations and nightmares. This may be a medication reaction. This may be do to the Decadron that you were given 2 days ago. This medication only last for approximately 72 hours. However given your symptoms present after taking her clindamycin, and this may be related to this antibiotic although this is unlikely. They stopped taking her clindamycin. We are starting you on azithromycin for your tonsillitis and otitis media. Please followup with your primary care physician as needed. If you develop worsening confusion, fever, vomiting and cannot stop, pain, difficulty breathing, please return to the emergency department.

## 2013-08-15 ENCOUNTER — Emergency Department (HOSPITAL_COMMUNITY): Payer: Self-pay

## 2013-08-15 ENCOUNTER — Encounter (HOSPITAL_COMMUNITY): Payer: Self-pay | Admitting: Emergency Medicine

## 2013-08-15 ENCOUNTER — Inpatient Hospital Stay (HOSPITAL_COMMUNITY)
Admission: EM | Admit: 2013-08-15 | Discharge: 2013-08-20 | DRG: 644 | Disposition: A | Discharge status: Home / Self Care | Payer: Self-pay | Attending: Family Medicine | Admitting: Family Medicine

## 2013-08-15 DIAGNOSIS — D509 Iron deficiency anemia, unspecified: Secondary | ICD-10-CM | POA: Diagnosis present

## 2013-08-15 DIAGNOSIS — D649 Anemia, unspecified: Secondary | ICD-10-CM | POA: Diagnosis present

## 2013-08-15 DIAGNOSIS — E039 Hypothyroidism, unspecified: Principal | ICD-10-CM | POA: Diagnosis present

## 2013-08-15 DIAGNOSIS — Z833 Family history of diabetes mellitus: Secondary | ICD-10-CM

## 2013-08-15 DIAGNOSIS — G4733 Obstructive sleep apnea (adult) (pediatric): Secondary | ICD-10-CM | POA: Diagnosis present

## 2013-08-15 DIAGNOSIS — Z87891 Personal history of nicotine dependence: Secondary | ICD-10-CM

## 2013-08-15 DIAGNOSIS — H9190 Unspecified hearing loss, unspecified ear: Secondary | ICD-10-CM | POA: Diagnosis present

## 2013-08-15 DIAGNOSIS — R55 Syncope and collapse: Secondary | ICD-10-CM | POA: Diagnosis present

## 2013-08-15 DIAGNOSIS — I6529 Occlusion and stenosis of unspecified carotid artery: Secondary | ICD-10-CM | POA: Diagnosis present

## 2013-08-15 DIAGNOSIS — E049 Nontoxic goiter, unspecified: Secondary | ICD-10-CM | POA: Diagnosis present

## 2013-08-15 DIAGNOSIS — R001 Bradycardia, unspecified: Secondary | ICD-10-CM | POA: Diagnosis present

## 2013-08-15 DIAGNOSIS — Z6837 Body mass index (BMI) 37.0-37.9, adult: Secondary | ICD-10-CM

## 2013-08-15 DIAGNOSIS — I495 Sick sinus syndrome: Secondary | ICD-10-CM | POA: Diagnosis present

## 2013-08-15 DIAGNOSIS — E871 Hypo-osmolality and hyponatremia: Secondary | ICD-10-CM | POA: Diagnosis present

## 2013-08-15 DIAGNOSIS — E063 Autoimmune thyroiditis: Secondary | ICD-10-CM | POA: Diagnosis present

## 2013-08-15 DIAGNOSIS — J309 Allergic rhinitis, unspecified: Secondary | ICD-10-CM | POA: Diagnosis present

## 2013-08-15 DIAGNOSIS — D1800 Hemangioma unspecified site: Secondary | ICD-10-CM | POA: Diagnosis present

## 2013-08-15 DIAGNOSIS — Z79899 Other long term (current) drug therapy: Secondary | ICD-10-CM

## 2013-08-15 DIAGNOSIS — Z9089 Acquired absence of other organs: Secondary | ICD-10-CM

## 2013-08-15 DIAGNOSIS — IMO0002 Reserved for concepts with insufficient information to code with codable children: Secondary | ICD-10-CM | POA: Diagnosis present

## 2013-08-15 NOTE — ED Notes (Signed)
Bed: WA01 Expected date:  Expected time:  Means of arrival:  Comments: EMS 

## 2013-08-15 NOTE — ED Notes (Signed)
Per EMS report: pt seen recently a dx with tonsillitis.  Pt also has a hx of a goiter.  Pt discharged from [facility] and given an ENT consult and antibiotics.  Pt a/o x 4.  Voice muffled.

## 2013-08-15 NOTE — ED Provider Notes (Signed)
CSN: [id number]     Arrival date & time 08/15/13  2219 History   First MD Initiated Contact with Patient 08/15/13 2304     Chief Complaint  Patient presents with  . Oral Swelling     (Consider location/radiation/quality/duration/timing/severity/associated sxs/prior Treatment) HPI The patient's been several times to the emergency department the last few days for difficulty breathing and throat swelling swelling sensation. She's been diagnosed with tonsillitis and started on clindamycin but switched to azithromycin due to concern that was causing hallucination and nightmares. Patient is here tonight after an questionable syncope episode while sitting down. Patient's friend stated the patient stopped breathing and slumped over a chair. EMS was called. Patient states that she last remember EMS arriving. She denies any sore throat. She's had increased fatigue and swelling especially around the eyelids and lower extremities. She denies fevers or chills. Patient is being worked up for thyroid disease but is yet to hear back from laboratory testing. Past Medical History  Diagnosis Date  . Goiter   . Anemia   . Temporary low platelet count    Past Surgical History  Procedure Laterality Date  . Cholecystectomy     Family History  Problem Relation Age of Onset  . Diabetes Mother   . Diabetes Other    History  Substance Use Topics  . Smoking status: Former Research scientist (life sciences)  . Smokeless tobacco: Not on file  . Alcohol Use: No   OB History   Grav Para Term Preterm Abortions TAB SAB Ect Mult Living                 Review of Systems  Constitutional: Negative for fever and chills.  HENT: Negative for sore throat.   Respiratory: Positive for choking and shortness of breath. Negative for cough, wheezing and stridor.   Cardiovascular: Positive for leg swelling. Negative for chest pain and palpitations.  Gastrointestinal: Negative for nausea, vomiting, abdominal pain and diarrhea.  Musculoskeletal:  Negative for back pain, myalgias, neck pain and neck stiffness.  Skin: Negative for rash and wound.  Neurological: Positive for syncope and weakness (generalized). Negative for dizziness, light-headedness, numbness and headaches.  All other systems reviewed and are negative.     Allergies  Clindamycin/lincomycin; Penicillins; and Veetids  Home Medications   Prior to Admission medications   Medication Sig Start Date End Date Taking? Authorizing Provider  azithromycin (ZITHROMAX) 250 MG tablet Take 1 tablet (250 mg total) by mouth daily. Take first 2 tablets together, then 1 every day until finished. 08/14/13  Yes Kristen N Ward, DO  COCONUT OIL PO Take 1 capsule by mouth daily.   Yes Historical Provider, MD  doxycycline (VIBRA-TABS) 100 MG tablet Take 100 mg by mouth daily.   Yes Historical Provider, MD  famotidine-calcium carbonate-magnesium hydroxide (PEPCID COMPLETE) [phone] MG CHEW chewable tablet Chew 1 tablet by mouth at bedtime as needed (heartburn).   Yes Historical Provider, MD  ferrous sulfate 325 (65 FE) MG tablet Take 325 mg by mouth 3 (three) times daily with meals.    Yes Historical Provider, MD  fluticasone (FLONASE) 50 MCG/ACT nasal spray Place 1 spray into both nostrils daily.    Yes Historical Provider, MD  ipratropium (ATROVENT) 0.06 % nasal spray Place 2 sprays into both nostrils 4 (four) times daily. 07/20/13  Yes Harden Mo, MD  Multiple Vitamin (MULTIVITAMIN WITH MINERALS) TABS tablet Take 1 tablet by mouth daily.   Yes Historical Provider, MD  omega-3 acid ethyl esters (LOVAZA) 1 G capsule Take  1 g by mouth daily.    Yes Historical Provider, MD  omeprazole (PRILOSEC) 20 MG capsule Take 40 mg by mouth daily with breakfast.   Yes Historical Provider, MD   BP 150/87  Pulse 79  Temp(Src) 98.2 F (36.8 C) (Oral)  Resp 18  SpO2 98%  LMP 07/27/2013 Physical Exam  Nursing note and vitals reviewed. Constitutional: She is oriented to person, place, and time. She  appears well-developed and well-nourished. No distress.  Patient in no respiratory distress. She is making voluntary upper airway sounds.  HENT:  Head: Normocephalic and atraumatic.  Mouth/Throat: Oropharynx is clear and moist. No oropharyngeal exudate.  Mild tonsillar large but the airway appears to be patent. Thinning of the lateral eyebrows.   Eyes: EOM are normal. Pupils are equal, round, and reactive to light.  Superior periorbital edema  Neck: Normal range of motion. Neck supple. No tracheal deviation present. Thyromegaly (left greater than right thyroid swelling) present.  Cardiovascular: Normal rate and regular rhythm.   Pulmonary/Chest: Effort normal and breath sounds normal. No stridor. No respiratory distress. She has no wheezes. She has no rales. She exhibits no tenderness.  Abdominal: Soft. Bowel sounds are normal. She exhibits no distension and no mass. There is no tenderness. There is no rebound and no guarding.  Musculoskeletal: Normal range of motion. She exhibits no edema and no tenderness.  Lymphadenopathy:    She has no cervical adenopathy.  Neurological: She is alert and oriented to person, place, and time.  Moves all extremities without deficit. Sensation grossly intact. Decreased deep tendon reflexes  Skin: Skin is warm and dry. No rash noted. No erythema.    ED Course  Procedures (including critical care time) Labs Review Labs Reviewed  CBC WITH DIFFERENTIAL  COMPREHENSIVE METABOLIC PANEL  URINALYSIS, ROUTINE W REFLEX MICROSCOPIC    Imaging Review No results found.   EKG Interpretation None      Date: 08/16/2013  Rate: 63  Rhythm: normal sinus rhythm  QRS Axis: normal  Intervals: normal  ST/T Wave abnormalities: nonspecific T wave changes  Conduction Disutrbances:none  Narrative Interpretation:   Old EKG Reviewed: none available   MDM   Final diagnoses:  None   Called and the patient's room for "not breathing". Patient again in no  respiratory distress. She is breath holding. Her lungs are clear. Oxygen saturations remained 100%. No seizure-like activity or cyanosis.  Thyroid testing from 5:15 shows profound hypothyroidism. Discussed with [facility] and will admit for myxedema and syncope. Vital signs stable in the emergency department.  Julianne Rice, MD 08/16/13 [phone]

## 2013-08-16 ENCOUNTER — Inpatient Hospital Stay (HOSPITAL_COMMUNITY): Payer: Self-pay

## 2013-08-16 DIAGNOSIS — E039 Hypothyroidism, unspecified: Secondary | ICD-10-CM | POA: Diagnosis present

## 2013-08-16 DIAGNOSIS — I519 Heart disease, unspecified: Secondary | ICD-10-CM

## 2013-08-16 DIAGNOSIS — R001 Bradycardia, unspecified: Secondary | ICD-10-CM | POA: Diagnosis present

## 2013-08-16 DIAGNOSIS — I498 Other specified cardiac arrhythmias: Secondary | ICD-10-CM

## 2013-08-16 DIAGNOSIS — E049 Nontoxic goiter, unspecified: Secondary | ICD-10-CM | POA: Diagnosis present

## 2013-08-16 DIAGNOSIS — R55 Syncope and collapse: Secondary | ICD-10-CM | POA: Diagnosis present

## 2013-08-16 DIAGNOSIS — D649 Anemia, unspecified: Secondary | ICD-10-CM | POA: Diagnosis present

## 2013-08-16 LAB — RETICULOCYTES
RBC.: 4.3 MIL/uL (ref 3.87–5.11)
Retic Count, Absolute: 107.5 10*3/uL (ref 19.0–186.0)
Retic Ct Pct: 2.5 % (ref 0.4–3.1)

## 2013-08-16 LAB — CBC WITH DIFFERENTIAL/PLATELET
Basophils Absolute: 0 10*3/uL (ref 0.0–0.1)
Basophils Relative: 0 % (ref 0–1)
EOS PCT: 2 % (ref 0–5)
Eosinophils Absolute: 0.2 10*3/uL (ref 0.0–0.7)
HEMATOCRIT: 33.5 % — AB (ref 36.0–46.0)
Hemoglobin: 9.4 g/dL — ABNORMAL LOW (ref 12.0–15.0)
LYMPHS ABS: 2.3 10*3/uL (ref 0.7–4.0)
Lymphocytes Relative: 20 % (ref 12–46)
MCH: 20.8 pg — AB (ref 26.0–34.0)
MCHC: 28.1 g/dL — ABNORMAL LOW (ref 30.0–36.0)
MCV: 74.1 fL — AB (ref 78.0–100.0)
MONO ABS: 0.6 10*3/uL (ref 0.1–1.0)
Monocytes Relative: 5 % (ref 3–12)
NEUTROS ABS: 8.5 10*3/uL — AB (ref 1.7–7.7)
Neutrophils Relative %: 73 % (ref 43–77)
Platelets: 359 10*3/uL (ref 150–400)
RBC: 4.52 MIL/uL (ref 3.87–5.11)
WBC: 11.6 10*3/uL — AB (ref 4.0–10.5)

## 2013-08-16 LAB — COMPREHENSIVE METABOLIC PANEL
ALK PHOS: 72 U/L (ref 39–117)
ALT: 63 U/L — ABNORMAL HIGH (ref 0–35)
AST: 76 U/L — ABNORMAL HIGH (ref 0–37)
Albumin: 4.4 g/dL (ref 3.5–5.2)
BUN: 9 mg/dL (ref 6–23)
CHLORIDE: 90 meq/L — AB (ref 96–112)
CO2: 32 meq/L (ref 19–32)
Calcium: 10.3 mg/dL (ref 8.4–10.5)
Creatinine, Ser: 1.07 mg/dL (ref 0.50–1.10)
GFR calc Af Amer: 70 mL/min — ABNORMAL LOW (ref >90)
GFR, EST NON AFRICAN AMERICAN: 61 mL/min — AB (ref >90)
GLUCOSE: 107 mg/dL — AB (ref 70–99)
POTASSIUM: 3.7 meq/L (ref 3.7–5.3)
Sodium: 133 mEq/L — ABNORMAL LOW (ref 137–147)
Total Protein: 8.3 g/dL (ref 6.0–8.3)

## 2013-08-16 LAB — CBC
HCT: 32.2 % — ABNORMAL LOW (ref 36.0–46.0)
Hemoglobin: 9.2 g/dL — ABNORMAL LOW (ref 12.0–15.0)
MCH: 21 pg — ABNORMAL LOW (ref 26.0–34.0)
MCHC: 28.6 g/dL — ABNORMAL LOW (ref 30.0–36.0)
MCV: 73.3 fL — ABNORMAL LOW (ref 78.0–100.0)
Platelets: 317 10*3/uL (ref 150–400)
RBC: 4.39 MIL/uL (ref 3.87–5.11)
WBC: 10.6 10*3/uL — ABNORMAL HIGH (ref 4.0–10.5)

## 2013-08-16 LAB — URINALYSIS, ROUTINE W REFLEX MICROSCOPIC
Bilirubin Urine: NEGATIVE
GLUCOSE, UA: NEGATIVE mg/dL
Hgb urine dipstick: NEGATIVE
Ketones, ur: NEGATIVE mg/dL
LEUKOCYTES UA: NEGATIVE
Nitrite: NEGATIVE
PROTEIN: NEGATIVE mg/dL
Specific Gravity, Urine: 1.01 (ref 1.005–1.030)
UROBILINOGEN UA: 0.2 mg/dL (ref 0.0–1.0)
pH: 6.5 (ref 5.0–8.0)

## 2013-08-16 LAB — HEPATITIS PANEL, ACUTE
HCV Ab: NEGATIVE
Hep A IgM: NONREACTIVE
Hep B C IgM: NONREACTIVE
Hepatitis B Surface Ag: NEGATIVE

## 2013-08-16 LAB — HEPATIC FUNCTION PANEL
ALBUMIN: 4 g/dL (ref 3.5–5.2)
ALK PHOS: 68 U/L (ref 39–117)
ALT: 59 U/L — AB (ref 0–35)
AST: 70 U/L — ABNORMAL HIGH (ref 0–37)
Bilirubin, Direct: <0.2 mg/dL (ref 0.0–0.3)
TOTAL PROTEIN: 7.6 g/dL (ref 6.0–8.3)
Total Bilirubin: <0.2 mg/dL — ABNORMAL LOW (ref 0.3–1.2)

## 2013-08-16 LAB — TROPONIN I
Troponin I: <0.3 ng/mL (ref <0.30)
Troponin I: <0.3 ng/mL (ref <0.30)
Troponin I: <0.3 ng/mL (ref <0.30)

## 2013-08-16 LAB — BASIC METABOLIC PANEL WITH GFR
BUN: 8 mg/dL (ref 6–23)
CO2: 31 meq/L (ref 19–32)
Calcium: 9.9 mg/dL (ref 8.4–10.5)
Chloride: 94 meq/L — ABNORMAL LOW (ref 96–112)
Creatinine, Ser: 0.92 mg/dL (ref 0.50–1.10)
GFR calc Af Amer: 85 mL/min — ABNORMAL LOW
GFR calc non Af Amer: 73 mL/min — ABNORMAL LOW
Glucose, Bld: 103 mg/dL — ABNORMAL HIGH (ref 70–99)
Potassium: 3.7 meq/L (ref 3.7–5.3)
Sodium: 137 meq/L (ref 137–147)

## 2013-08-16 LAB — IRON AND TIBC
Iron: 25 ug/dL — ABNORMAL LOW (ref 42–135)
Saturation Ratios: 7 % — ABNORMAL LOW (ref 20–55)
TIBC: 343 ug/dL (ref 250–470)
UIBC: 318 ug/dL (ref 125–400)

## 2013-08-16 LAB — PREGNANCY, URINE: PREG TEST UR: NEGATIVE

## 2013-08-16 LAB — TSH: TSH: >100 u[IU]/mL — ABNORMAL HIGH (ref 0.350–4.500)

## 2013-08-16 LAB — FERRITIN: Ferritin: 32 ng/mL (ref 10–291)

## 2013-08-16 LAB — FOLATE: Folate: 16 ng/mL

## 2013-08-16 LAB — VITAMIN B12: Vitamin B-12: 553 pg/mL (ref 211–911)

## 2013-08-16 LAB — T4, FREE: FREE T4: 0.22 ng/dL — AB (ref 0.80–1.80)

## 2013-08-16 MED ORDER — FERROUS SULFATE 325 (65 FE) MG PO TABS
325.0000 mg | ORAL_TABLET | Freq: Three times a day (TID) | ORAL | Status: DC
  Administered 2013-08-16 – 2013-08-20 (×12): 325 mg via ORAL
  Filled 2013-08-16 (×16): qty 1

## 2013-08-16 MED ORDER — ONDANSETRON HCL 4 MG/2ML IJ SOLN: 4.0000 mg | Freq: Four times a day (QID) | INTRAMUSCULAR | Status: DC | PRN

## 2013-08-16 MED ORDER — IPRATROPIUM BROMIDE 0.03 % NA SOLN
2.0000 | Freq: Four times a day (QID) | NASAL | Status: DC
  Administered 2013-08-16 – 2013-08-20 (×14): 2 via NASAL
  Filled 2013-08-16: qty 30

## 2013-08-16 MED ORDER — LEVOTHYROXINE SODIUM 100 MCG IV SOLR
50.0000 ug | Freq: Every day | INTRAVENOUS | Status: DC
  Administered 2013-08-16: 50 ug via INTRAVENOUS
  Filled 2013-08-16: qty 5

## 2013-08-16 MED ORDER — LEVOTHYROXINE SODIUM 100 MCG IV SOLR
150.0000 ug | Freq: Once | INTRAVENOUS | Status: AC
  Administered 2013-08-16: 150 ug via INTRAVENOUS
  Filled 2013-08-16: qty 10

## 2013-08-16 MED ORDER — AZITHROMYCIN 250 MG PO TABS
250.0000 mg | ORAL_TABLET | Freq: Every day | ORAL | Status: DC
  Administered 2013-08-16 – 2013-08-20 (×5): 250 mg via ORAL
  Filled 2013-08-16 (×5): qty 1

## 2013-08-16 MED ORDER — ALUM & MAG HYDROXIDE-SIMETH 200-200-20 MG/5ML PO SUSP: 30.0000 mL | Freq: Four times a day (QID) | ORAL | Status: DC | PRN

## 2013-08-16 MED ORDER — FLUTICASONE PROPIONATE 50 MCG/ACT NA SUSP
1.0000 | Freq: Every day | NASAL | Status: DC
  Administered 2013-08-16 – 2013-08-17 (×2): 1 via NASAL
  Filled 2013-08-16: qty 16

## 2013-08-16 MED ORDER — GUAIFENESIN-DM 100-10 MG/5ML PO SYRP
5.0000 mL | ORAL_SOLUTION | ORAL | Status: DC | PRN
Start: 2013-08-16 — End: 2013-08-20

## 2013-08-16 MED ORDER — SODIUM CHLORIDE 0.9 % IJ SOLN
3.0000 mL | Freq: Two times a day (BID) | INTRAMUSCULAR | Status: DC
Start: 2013-08-16 — End: 2013-08-20
  Administered 2013-08-16 – 2013-08-19 (×8): 3 mL via INTRAVENOUS

## 2013-08-16 MED ORDER — POLYETHYLENE GLYCOL 3350 17 G PO PACK
17.0000 g | PACK | Freq: Every day | ORAL | Status: DC | PRN
Start: 2013-08-16 — End: 2013-08-20
  Filled 2013-08-16: qty 1

## 2013-08-16 MED ORDER — COSYNTROPIN 0.25 MG IJ SOLR
0.2500 mg | Freq: Once | INTRAMUSCULAR | Status: AC
  Administered 2013-08-17: 0.25 mg via INTRAVENOUS
  Filled 2013-08-16: qty 0.25

## 2013-08-16 MED ORDER — ACETAMINOPHEN 325 MG PO TABS
650.0000 mg | ORAL_TABLET | Freq: Four times a day (QID) | ORAL | Status: DC | PRN
  Administered 2013-08-17: 650 mg via ORAL
  Filled 2013-08-16: qty 2

## 2013-08-16 MED ORDER — SODIUM CHLORIDE 0.9 % IV SOLN
INTRAVENOUS | Status: DC
  Administered 2013-08-16: 04:00:00 via INTRAVENOUS

## 2013-08-16 MED ORDER — HEPARIN SODIUM (PORCINE) 5000 UNIT/ML IJ SOLN
5000.0000 [IU] | Freq: Three times a day (TID) | INTRAMUSCULAR | Status: DC
  Administered 2013-08-16 – 2013-08-20 (×12): 5000 [IU] via SUBCUTANEOUS
  Filled 2013-08-16 (×16): qty 1

## 2013-08-16 MED ORDER — PANTOPRAZOLE SODIUM 40 MG PO TBEC
40.0000 mg | DELAYED_RELEASE_TABLET | Freq: Every day | ORAL | Status: DC
  Administered 2013-08-16 – 2013-08-20 (×5): 40 mg via ORAL
  Filled 2013-08-16 (×5): qty 1

## 2013-08-16 MED ORDER — ALBUTEROL SULFATE (2.5 MG/3ML) 0.083% IN NEBU: 2.5000 mg | INHALATION_SOLUTION | RESPIRATORY_TRACT | Status: DC | PRN

## 2013-08-16 MED ORDER — OMEGA-3-ACID ETHYL ESTERS 1 G PO CAPS
1.0000 g | ORAL_CAPSULE | Freq: Every day | ORAL | Status: DC
  Administered 2013-08-16 – 2013-08-20 (×5): 1 g via ORAL
  Filled 2013-08-16 (×5): qty 1

## 2013-08-16 MED ORDER — ADULT MULTIVITAMIN W/MINERALS CH
1.0000 | ORAL_TABLET | Freq: Every day | ORAL | Status: DC
  Administered 2013-08-16 – 2013-08-20 (×5): 1 via ORAL
  Filled 2013-08-16 (×5): qty 1

## 2013-08-16 MED ORDER — ONDANSETRON HCL 4 MG PO TABS: 4.0000 mg | ORAL_TABLET | Freq: Four times a day (QID) | ORAL | Status: DC | PRN

## 2013-08-16 MED ORDER — HYDROCODONE-ACETAMINOPHEN 5-325 MG PO TABS: 1.0000 | ORAL_TABLET | ORAL | Status: DC | PRN

## 2013-08-16 NOTE — Progress Notes (Signed)
CARE MANAGEMENT NOTE 08/16/2013  Patient:  Sharon Hensley,Sharon Hensley   Account Number:  [id number]  Date Initiated:  08/16/2013  Documentation initiated by:  Gabriel Earing  Subjective/Objective Assessment:   pt admitted with cco oral swelling, from Urgent Care     Action/Plan:   fom home   Anticipated DC Date:  08/19/2013   Anticipated DC Plan:  [place]  CM consult      Choice offered to / List presented to:             Status of service:  In process, will continue to follow Medicare Important Message given?   (If response is "NO", the following Medicare IM given date fields will be blank) Date Medicare IM given:   Date Additional Medicare IM given:    Discharge Disposition:    Per UR Regulation:  Reviewed for med. necessity/level of care/duration of stay  If discussed at [facility] of Stay Meetings, dates discussed:    Comments:  08/16/13 MMcGibboney, RN, BSN Chart reviewed.

## 2013-08-16 NOTE — Consult Note (Signed)
Reason for Consult: Myxedema Referring Physician: Dr. Hulen Skains is an 48 y.o. female.   History of present illness:  For a "very long time", Sharon Hensley has felt ill--particularly worse over the past 2 months. She describes low energy with associated labile mood and weight gain. Her face has seemed puffy.  She describes moments of confusion.  No apparent precipitating or contributing factors. No previous diagnosis of a thyroid condition until her goiter was discovered in April 2015.  No known family history of thyroid disorders.  Past Medical History  Diagnosis Date  . Goiter   . Anemia   . Temporary low platelet count     Past Surgical History  Procedure Laterality Date  . Cholecystectomy      Family History  Problem Relation Age of Onset  . Diabetes Mother   . Diabetes Other     Social History:  reports that she has quit smoking. She does not have any smokeless tobacco history on file. She reports that she does not drink alcohol or use illicit drugs.  She is accompanied by her partner, Erline Levine.  Both of them work in the [employer].  Allergies:  Allergies  Allergen Reactions  . Clindamycin/Lincomycin Anaphylaxis  . Penicillins Hives and Itching  . Veetids [Penicillin V] Hives and Itching   Medications: I have reviewed the patient's current medications.  ROS General: No weight loss. Endocrine:  Cold intolerance, no heat intolerance. Neck:  Hoarseness and occasional difficulty swallowing. Cardiovascular: No palpitations, intermittent edema in ankles. Gastrointestinal: Constipation. Neurologic: No tremor, intermittent diffuse paresthesias. Psychiatric: Depression. Gynecologic:  Sometimes heavy and frequent menses. Dermatologic:  Premature graying around age 3 (similar to family members), hair loss, no vitiligo.  Blood pressure 129/76, pulse 77, temperature 98.2 F (36.8 C), temperature source Oral, resp. rate 18, height 5\' 5"  (1.651 m), weight 101.424 kg (223  lb 9.6 oz), last menstrual period 07/27/2013, SpO2 95.00%. Physical Exam General: No acute distress. Eyes: Anicteric, no scleral show, periorbital puffiness, no lid lag. Neck: Supple, trachea midline. Thyroid: symmetric enlargement, not tender, no discrete nodule, mobile without fixation. Cardiovascular: Regular rhythm and rate, no murmur, normal radial pulses. Respiratory: Normal respiratory effort, clear to auscultation. Gastrointestinal: Normal pitch active bowel sounds, nontender abdomen without distention or appreciable hepatomegaly. Neurologic: Cranial nerves normal as tested except hearing loss, generalized hyporeflexia, no tremor. Musculoskeletal: Normal muscle tone, no muscle atrophy. Skin: Cool skin, no visible rash. Mental status: moderately restricted affect congruent with mood, no hallucinations or delusions evident. Hematologic/lymphatic: No cervical adenopathy, no jaundice.  Lab Results  Component Value Date   TSH >100.000* 08/16/2013   T3FREE <0.3* 08/12/2013   FREET4 0.22* 08/15/2013   HGB 9.2* 08/16/2013   MCV 73.3* 08/16/2013   NA 137 08/16/2013   K 3.7 08/16/2013   CO2 31 08/16/2013   GLUCOSE 103* 08/16/2013   CREATININE 0.92 08/16/2013   AST 70* 08/16/2013   ALT 59* 08/16/2013   PREGTESTUR NEGATIVE 08/15/2013   Dg Neck Soft Tissue  08/16/2013   CLINICAL DATA:  ORAL SWELLING  Is it is okay 04/05/2001  EXAM: NECK SOFT TISSUES - 1+ VIEW  COMPARISON:  CT NECK W/CM dated 08/12/2013  FINDINGS: When correlated with prior CT there has been no significant change. There is prominence in the palatine tonsils and adenoid regions. No acute osseous abnormalities.  IMPRESSION: Prominence in the palatine tonsils and adenoid regions.   Electronically Signed   By: Margaree Mackintosh M.D.   On: 08/16/2013 00:20   US  Soft Tissue Head/neck  08/16/2013   CLINICAL DATA:  48 year old female with goiter. Neck mass. Initial encounter.  EXAM: THYROID ULTRASOUND  TECHNIQUE: Ultrasound examination of the  thyroid gland and adjacent soft tissues was performed.  COMPARISON:  Neck CT 08/12/2013.  FINDINGS: Right thyroid lobe  Measurements: 6.2 x 3.0 x 3.9 cm. Diffusely heterogeneous echotexture. No discrete nodule.  Left thyroid lobe  Measurements: 6.5 x 3.1 x 3.2 cm. Diffusely heterogeneous echotexture, lobe replaced by diffusely nodular parenchyma (image 24). No dominant nodule.  Isthmus  Thickness: 2 mm.  No nodules visualized.  Lymphadenopathy  Small level 4 nodes with normal fatty hila, up to 7 mm short axis.  IMPRESSION: Diffuse thyromegaly and marked heterogeneity of thyroid parenchyma, with no dominant nodule or mass. Query previous thyroiditis. No surrounding lymphadenopathy.   Electronically Signed   By: Lars Pinks M.D.   On: 08/16/2013 10:08   Assessment/Plan: 1.  Severe hypothyroidism--with multiple associated symptoms and signs.  2.  Goiter. 3.  Premature graying.  Today I reviewed lab reports, office notes, hospital notes, radiology reports, thyroid ultrasound images, an ECG report, and an ECG tracing.  Likely she has Hashimoto's thyroiditis.  I spoke with Dr. Algis Liming before and after my consult visit with [facility].    To help Josceline, I recommend a loading dose of 200 micrograms of intravenous levothyroxine today. Since she has already received 50 mcg of intravenous levothyroxine this morning, I have ordered an additional 150 mcg of intravenous levothyroxine now.    Starting tomorrow, I plan to use 100 mcg of intravenous levothyroxine per day for 2 days. After the initial days of intravenous levothyroxine, then transition to levothyroxine 150 mcg tablet or capsule by mouth once a day.  Of course, the doses and transition to oral levothyroxine will depend upon her initial response to the loading dose.    I have ordered thyroid peroxidase antibody lab test to check for Hashimoto's thyroiditis.  I have ordered daily free T4 level to monitor replacement therapy.  I have ordered a basic metabolic  panel for tomorrow morning to monitor kidney function and certain electrolytes.   I reviewed the care plan with the patient, her partner, and her nurses (Susie and [place]).    I plan to visit Asencion Partridge again tomorrow evening. Please do not hesitate to call if any questions or concerns arise.  Delrae Rend 08/16/2013, 6:22 PM

## 2013-08-16 NOTE — Progress Notes (Signed)
Went into patients room to check ECG leads, and patient was out of bed with the leads off. Patient was confused stating "Donnald Garre been here for 45 minutes so I thought I could go." patient was reoriented. Patient then stated "this ear has been draining blood all day." Q-tip with dark brown drainage was noticed on bedside table. Dr. Algis Liming was notified and said to continue monitoring the patient closely and page again if there were any changes. Will continue to monitor.

## 2013-08-16 NOTE — H&P (Addendum)
Patient Demographics  Sharon Hensley, is a 48 y.o. female  MRN: [id number]   DOB - 08/09/1965  Admit Date - 08/15/2013  Outpatient Primary MD for the patient is Tamsen Roers, MD   With History of -  Past Medical History  Diagnosis Date  . Goiter   . Anemia   . Temporary low platelet count       Past Surgical History  Procedure Laterality Date  . Cholecystectomy      in for   Chief Complaint  Patient presents with  . Oral Swelling     HPI  Sharon Hensley  is a 48 y.o. female, with history of anemia, recently diagnosed in urgent care with goiter who has been coming to the ER frequently off late presents today with 4-6 weeks history of generalized weakness and fatigue, feels exhausted, throat swelling, easy weight gain, heat intolerance, constipation, symptoms gradually getting worse, she went to urgent care where she was diagnosed with a goiter she was requested to see her endocrinologist but she could not get an appointment to September of this year, symptoms continued to get worse and she seek attention in the ER a few times in the last week, presents again today, I was called to admit the patient for above problems. On review a few days ago patient had a TSH checked which was greater than 100, her strep throat was negative, her likely symptoms appear to be myxedema with goiter.    Review of Systems    In addition to the HPI above,   No Fever-chills, No Headache, No changes with Vision or hearing, Feels neck is swollen, white cells changed  No Chest pain, Cough or Shortness of Breath, No Abdominal pain, No Nausea or Vommitting, she is constipated No Blood in stool or Urine, No dysuria, No new skin rashes or bruises, No new joints pains-aches,  No new weakness, tingling, numbness in any extremity,  generalized weakness and fatigue No recent weight  loss, she gains weight easily and feels warm all the time No polyuria, polydypsia or polyphagia, No significant Mental Stressors.  A full 10 point Review of Systems was done, except as stated above, all other Review of Systems were negative.   Social History History  Substance Use Topics  . Smoking status: Former Research scientist (life sciences)  . Smokeless tobacco: Not on file  . Alcohol Use: No      Family History Family History  Problem Relation Age of Onset  . Diabetes Mother   . Diabetes Other       Prior to Admission medications   Medication Sig Start Date End Date Taking? Authorizing Provider  azithromycin (ZITHROMAX) 250 MG tablet Take 1 tablet (250 mg total) by mouth daily. Take first 2 tablets together, then 1 every day until finished. 08/14/13  Yes Kristen N Ward, DO  COCONUT OIL PO Take 1 capsule by mouth daily.   Yes Historical Provider, MD  doxycycline (VIBRA-TABS) 100 MG tablet Take  100 mg by mouth daily.   Yes Historical Provider, MD  famotidine-calcium carbonate-magnesium hydroxide (PEPCID COMPLETE) 10-800-165 MG CHEW chewable tablet Chew 1 tablet by mouth at bedtime as needed (heartburn).   Yes Historical Provider, MD  ferrous sulfate 325 (65 FE) MG tablet Take 325 mg by mouth 3 (three) times daily with meals.    Yes Historical Provider, MD  fluticasone (FLONASE) 50 MCG/ACT nasal spray Place 1 spray into both nostrils daily.    Yes Historical Provider, MD  ipratropium (ATROVENT) 0.06 % nasal spray Place 2 sprays into both nostrils 4 (four) times daily. 07/20/13  Yes Harden Mo, MD  Multiple Vitamin (MULTIVITAMIN WITH MINERALS) TABS tablet Take 1 tablet by mouth daily.   Yes Historical Provider, MD  omega-3 acid ethyl esters (LOVAZA) 1 G capsule Take 1 g by mouth daily.    Yes Historical Provider, MD  omeprazole (PRILOSEC) 20 MG capsule Take 40 mg by mouth daily with breakfast.   Yes Historical Provider, MD    Allergies  Allergen  Reactions  . Clindamycin/Lincomycin Anaphylaxis  . Penicillins Hives and Itching  . Veetids [Penicillin V] Hives and Itching    Physical Exam  Vitals  Blood pressure 130/63, pulse 72, temperature 98.2 F (36.8 C), temperature source Oral, resp. rate 19, last menstrual period 07/27/2013, SpO2 95.00%.   1. General middle-aged white female lying in bed in NAD,     2. Normal affect and insight, Not Suicidal or Homicidal, Awake Alert, Oriented X 3.  3. No F.N deficits, ALL C.Nerves Intact, Strength 5/5 all 4 extremities, Sensation intact all 4 extremities, Plantars down going.  4. Ears and Eyes appear Normal, Conjunctivae clear, PERRLA. Moist Oral Mucosa.  5. Supple Neck, No JVD, No cervical lymphadenopathy appriciated, No Carotid Bruits. Smooth thyroid enlargement  6. Symmetrical Chest wall movement, Good air movement bilaterally, CTAB.  7. RRR, No Gallops, Rubs or Murmurs, No Parasternal Heave.  8. Positive Bowel Sounds, Abdomen Soft, Non tender, No organomegaly appriciated,No rebound -guarding or rigidity.  9.  No Cyanosis, Normal Skin Turgor, No Skin Rash or Bruise.  10. Good muscle tone,  joints appear normal , no effusions, Normal ROM.  11. No Palpable Lymph Nodes in Neck or Axillae     Data Review  CBC  Recent Labs Lab 08/12/13 2100 08/12/13 2101 08/15/13 2348  WBC 10.8*  --  11.6*  HGB 8.8* 11.9* 9.4*  HCT 32.1* 35.0* 33.5*  PLT 308  --  359  MCV 71.5*  --  74.1*  MCH 19.6*  --  20.8*  MCHC 27.4*  --  28.1*  RDW 30.8*  --  NOT CALCULATED  LYMPHSABS 2.3  --  2.3  MONOABS 0.5  --  0.6  EOSABS 0.1  --  0.2  BASOSABS 0.0  --  0.0   ------------------------------------------------------------------------------------------------------------------  Chemistries   Recent Labs Lab 08/12/13 2100 08/12/13 2101 08/15/13 2348  NA 137 136* 133*  K 3.7 3.4* 3.7  CL 94* 95* 90*  CO2 31  --  32  GLUCOSE 90 91 107*  BUN 9 7 9   CREATININE 1.01 1.10 1.07    CALCIUM 11.3*  --  10.3  AST 94*  --  76*  ALT 61*  --  63*  ALKPHOS 71  --  72  BILITOT 0.2*  --  <0.2*   ------------------------------------------------------------------------------------------------------------------ CrCl is unknown because both a height and weight (above a minimum accepted value) are required for this calculation. ------------------------------------------------------------------------------------------------------------------ No results found for this basename:  TSH, T4TOTAL, FREET3, T3FREE, THYROIDAB,  in the last 72 hours   Coagulation profile No results found for this basename: INR, PROTIME,  in the last 168 hours ------------------------------------------------------------------------------------------------------------------- No results found for this basename: DDIMER,  in the last 72 hours -------------------------------------------------------------------------------------------------------------------  Cardiac Enzymes No results found for this basename: CK, CKMB, TROPONINI, MYOGLOBIN,  in the last 168 hours ------------------------------------------------------------------------------------------------------------------ No components found with this basename: POCBNP,    ---------------------------------------------------------------------------------------------------------------  Urinalysis    Component Value Date/Time   COLORURINE YELLOW 08/15/2013 2350   APPEARANCEUR CLOUDY* 08/15/2013 2350   LABSPEC 1.010 08/15/2013 2350   PHURINE 6.5 08/15/2013 2350   GLUCOSEU NEGATIVE 08/15/2013 2350   HGBUR NEGATIVE 08/15/2013 2350   BILIRUBINUR NEGATIVE 08/15/2013 2350   KETONESUR NEGATIVE 08/15/2013 2350   PROTEINUR NEGATIVE 08/15/2013 2350   UROBILINOGEN 0.2 08/15/2013 2350   NITRITE NEGATIVE 08/15/2013 2350   LEUKOCYTESUR NEGATIVE 08/15/2013 2350     ----------------------------------------------------------------------------------------------------------------  Imaging results:   Dg Neck Soft Tissue  08/16/2013   CLINICAL DATA:  ORAL SWELLING  Is it is okay 04/05/2001  EXAM: NECK SOFT TISSUES - 1+ VIEW  COMPARISON:  CT NECK W/CM dated 08/12/2013  FINDINGS: When correlated with prior CT there has been no significant change. There is prominence in the palatine tonsils and adenoid regions. No acute osseous abnormalities.  IMPRESSION: Prominence in the palatine tonsils and adenoid regions.   Electronically Signed   By: Margaree Mackintosh M.D.   On: 08/16/2013 00:20   Dg Chest 2 View  07/20/2013   CLINICAL DATA:  Productive cough; shortness of breath  EXAM: CHEST  2 VIEW  COMPARISON:  None.  FINDINGS: There is no edema or consolidation. The heart size and pulmonary vascularity are normal. No adenopathy. No bone lesions.  IMPRESSION: No edema or consolidation.   Electronically Signed   By: Lowella Grip M.D.   On: 07/20/2013 12:46      My personal review of EKG: Rhythm NSR, Rate 63 /min,  no Acute ST changes    Assessment & Plan   1. Myxedema with recent TSH of greater than 100. Will be admitted, will provide her with IV Synthroid for the next few days then can be switched to oral Synthroid, will place her on telemetry as there could be some tachycardia/atrial flutter associated with thyroid correction. Will order thyroid ultrasound, prior to discharge kindly arrange for outpatient endocrine followup as patient could not get an appointment in September.    2. Anemia. Indices appear microcytic, order anemia panel, with hypothyroidism usually we expect macrocytic anemia.    3. Mildly elevated liver enzymes. Likely due to #1 above, she denies alcohol abuse, denies any history of hepatitis, will check hepatitis panel and order a right upper quadrant ultrasound.     4. Nonspecific tonsil findings on x-ray, no evidence of airway  obstruction, recent strep throat negative, will recommend outpatient ENT followup post discharge. She is finishing azithromycin course which will be continued.     DVT Prophylaxis Heparin    AM Labs Ordered, also please review Full Orders  Family Communication: Admission, patients condition and plan of care including tests being ordered have been discussed with the patient and her partner who indicate understanding and agree with the plan and Code Status.  Code Status full  Likely DC to  home  Condition fair  Time spent in minutes : 35    Thurnell Lose M.D on 08/16/2013 at 2:47 AM  Between 7am to 7pm - Pager - [phone]  After 7pm go to [email or web address] - password [email or web address]  And look for the night coverage person covering me after hours  [facility] Office  [phone]

## 2013-08-16 NOTE — Progress Notes (Signed)
NP-Kirby came in unit checked patient. No further order received.

## 2013-08-16 NOTE — Progress Notes (Signed)
RN called this NP secondary to pt having bradycardia on tele which RN was able to capture on 12 lead. Marked sinus bradycardia with HR in the 30s. Also, some pauses as high as 4 sec.  NP to talk to pt. Denies palpitations, dizziness, lightheadedness, or chest pain.  Reviewed meds, on no betablockers or rate slowing meds. Likely all due to myxedema. Has received IV Synthroid. Will place 0.1mg  Atropine at bedside or at least close by room. Troponin x 3. Pt is asymptomatic at this time.  Clance Boll, NP [facility]

## 2013-08-16 NOTE — Progress Notes (Signed)
Patient HR goes down to 30's with pause at 4.07 sec. EKG done with result : marked sinus brady with marked sinus arrythmia. On call NP paged and made aware about patient's condition.  New orderreceived to have atropine ready at bedside.

## 2013-08-16 NOTE — Progress Notes (Addendum)
PROGRESS NOTE    Sharon Hensley QQP:[id number] DOB: 06/30/1965 DOA: 08/15/2013 PCP: Tamsen Roers, MD  HPI/Brief narrative 48 year old female with history of recent frequent ED visits for upper respiratory tract symptoms, attributed to sinusitis-treated with antibiotics, then for possible tonsillitis- treated with antibiotics and steroids, developed a reaction to steroids (hallucinations and nightmares)-which were attributed to prednisone but antibiotics were changed from clindamycin to azithromycin at patient's request, presented to ED again on 5/18 with complaints of swelling sensation in the throat, difficulty breathing and questionable syncopal episode while sitting. She was recently diagnosed at an urgent care with goiter and has been having 4-6 weeks history of generalized weakness, fatigue, exhaustion, throat swelling sensation, weight gain, cold intolerance, constipation and symptoms have been progressively getting worse. She was referred to see Dr. Delrae Rend but was unable to get an early appointment. She had an appointment to see ENT today. TSH drawn a few days ago > 100  Assessment/Plan:  1. 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 Patient was admitted to telemetry. Started on IV Synthroid. Neck ultrasound shows diffuse thyromegaly and marked heterogeneity of thyroid parenchyma with no dominant nodule or mass-? Previous thyroiditis and no surrounding lymphadenopathy. Will discuss with endocrinology. Patient's upper airway symptoms may be secondary to goiter and soft tissue swelling from hypothyroid. No features of acute airway compromise. Endocrinology consulted-we'll see later today and recommend cosyntropin stim test to rule out adrenal insufficiency. 2. Sinus bradycardia and sinus pauses: Likely secondary to problem #1 and need to rule out OSA. Continue monitoring on telemetry. Avoid negative chronotropic medications. 2-D echo shows normal EF. 3. ? Syncope: 2-D echo with normal EF. Telemetry shows  intermittent sinus bradycardia in the 30s and pause-longest 2.52 seconds (not sure of 4 second pause-some artifacts). Orthostatic blood pressures negative. Check carotid Doppler. EKG at 4:30 AM shows sinus bradycardia in the 30s, low voltage, nonspecific T wave changes. 4. Minimal transaminitis: No GI symptoms. May be secondary to hypothyroid. Liver ultrasound shows possible cyst and left lobe? Hemangioma-consider MRI. Acute hepatitis panel negative. 5. Iron deficiency/Microcytic anemia: Stable. Will need outpatient workup. Continue iron supplements. 6. Morbid obesity/rule out OSA: As per patient, history of snoring and apneic spells where her partner gets concerned and wakes her up. Will need formal outpatient sleep study. No stridor or features of airway compromise. Monitor closely.   Code Status: Full Family Communication: Discussed with patient's partner Ms. Lyndal Pulley Disposition Plan: Remains inpatient. Home when medically stable.   Consultants:  Endocrinology: Dr. Delrae Rend  Procedures:  None  Antibiotics:  None   Subjective: Denies throat pain, difficulty breathing or difficulty swallowing. Complains of some stiffness in the neck and back. Denies dizziness, lightheadedness, chest pain, palpitations. As per nursing, periods of sinus bradycardia while asleep-asymptomatic.  Objective: Filed Vitals:   08/15/13 2315 08/16/13 0232 08/16/13 0421 08/16/13 0941  BP:  130/63 140/72 129/76  Pulse: 79 72 73 77  Temp:   98.2 F (36.8 C) 98.2 F (36.8 C)  TempSrc:   Oral Oral  Resp:  19 19 18   Height:   5\' 5"  (1.651 m)   Weight:   101.424 kg (223 lb 9.6 oz)   SpO2: 98% 95% 100% 95%    Intake/Output Summary (Last 24 hours) at 08/16/13 1312 Last data filed at 08/16/13 0954  Gross per 24 hour  Intake 201.75 ml  Output    775 ml  Net -573.25 ml   Filed Weights   08/16/13 0421  Weight: 101.424 kg (223 lb 9.6 oz)  Exam:  General exam: Pleasant young female,  moderately built and morbidly obese sitting up in bed without any distress. Puffiness of face. Thick. neck. Hoarce voice ENT: Thick neck. Large tongue, unable to visualize posterior pharyngeal wall or tonsils Respiratory system: Clear. No increased work of breathing. Cardiovascular system: S1 & S2 heard, RRR. No JVD, murmurs, gallops, clicks. Trace bilateral ankle edema. Gastrointestinal system: Abdomen is nondistended, soft and nontender. Normal bowel sounds heard. Central nervous system: Alert and oriented. No focal neurological deficits. Extremities: Symmetric 5 x 5 power.   Data Reviewed: Basic Metabolic Panel:  Recent Labs Lab 08/12/13 2100 08/12/13 2101 08/15/13 2348 08/16/13 0435  NA 137 136* 133* 137  K 3.7 3.4* 3.7 3.7  CL 94* 95* 90* 94*  CO2 31  --  32 31  GLUCOSE 90 91 107* 103*  BUN 9 7 9 8   CREATININE 1.01 1.10 1.07 0.92  CALCIUM 11.3*  --  10.3 9.9   Liver Function Tests:  Recent Labs Lab 08/12/13 2100 08/15/13 2348 08/16/13 0435  AST 94* 76* 70*  ALT 61* 63* 59*  ALKPHOS 71 72 68  BILITOT 0.2* <0.2* <0.2*  PROT 8.4* 8.3 7.6  ALBUMIN 4.4 4.4 4.0   No results found for this basename: LIPASE, AMYLASE,  in the last 168 hours No results found for this basename: AMMONIA,  in the last 168 hours CBC:  Recent Labs Lab 08/12/13 2100 08/12/13 2101 08/15/13 2348 08/16/13 0435  WBC 10.8*  --  11.6* 10.6*  NEUTROABS 7.9*  --  8.5*  --   HGB 8.8* 11.9* 9.4* 9.2*  HCT 32.1* 35.0* 33.5* 32.2*  MCV 71.5*  --  74.1* 73.3*  PLT 308  --  359 317   Cardiac Enzymes:  Recent Labs Lab 08/16/13 0435 08/16/13 1158  TROPONINI <0.30 <0.30   BNP (last 3 results) No results found for this basename: PROBNP,  in the last 8760 hours CBG: No results found for this basename: GLUCAP,  in the last 168 hours  Recent Results (from the past 240 hour(s))  RAPID STREP SCREEN     Status: None   Collection Time    08/12/13 11:06 PM      Result Value Ref Range Status    Streptococcus, Group A Screen (Direct) NEGATIVE  NEGATIVE Final   Comment: (NOTE)     A Rapid Antigen test may result negative if the antigen level in the     sample is below the detection level of this test. The FDA has not     cleared this test as a stand-alone test therefore the rapid antigen     negative result has reflexed to a Group A Strep culture.  CULTURE, GROUP A STREP     Status: None   Collection Time    08/12/13 11:06 PM      Result Value Ref Range Status   Specimen Description THROAT   Final   Special Requests NONE   Final   Culture     Final   Value: No Beta Hemolytic Streptococci Isolated     Performed at [facility]   Report Status 08/14/2013 FINAL   Final      Additional labs: 1. Anemia panel: Iron 25, TIBC 343, saturation ratio 7, ferritin 32, folate 16 and B12: 553, reticulocytes 107.5 2. Urine pregnancy test: Negative 3. TSH >100, free T4 0.22 on 5/19. Free T4 0.25 and free T3 less than 0.3 on 5/15 4. Acute hepatitis panel: Negative 5. 2-D  echo 08/16/13: Study Conclusions  - Left ventricle: The cavity size was normal. Wall thickness was normal. Systolic function was normal. The estimated ejection fraction was in the range of 55% to 60%. Wall motion was normal; there were no regional wall motion abnormalities.       Studies: Dg Neck Soft Tissue  08/16/2013   CLINICAL DATA:  ORAL SWELLING  Is it is okay 04/05/2001  EXAM: NECK SOFT TISSUES - 1+ VIEW  COMPARISON:  CT NECK W/CM dated 08/12/2013  FINDINGS: When correlated with prior CT there has been no significant change. There is prominence in the palatine tonsils and adenoid regions. No acute osseous abnormalities.  IMPRESSION: Prominence in the palatine tonsils and adenoid regions.   Electronically Signed   By: Margaree Mackintosh M.D.   On: 08/16/2013 00:20   US Soft Tissue Head/neck  08/16/2013   CLINICAL DATA:  48 year old female with goiter. Neck mass. Initial encounter.  EXAM: THYROID ULTRASOUND   TECHNIQUE: Ultrasound examination of the thyroid gland and adjacent soft tissues was performed.  COMPARISON:  Neck CT 08/12/2013.  FINDINGS: Right thyroid lobe  Measurements: 6.2 x 3.0 x 3.9 cm. Diffusely heterogeneous echotexture. No discrete nodule.  Left thyroid lobe  Measurements: 6.5 x 3.1 x 3.2 cm. Diffusely heterogeneous echotexture, lobe replaced by diffusely nodular parenchyma (image 24). No dominant nodule.  Isthmus  Thickness: 2 mm.  No nodules visualized.  Lymphadenopathy  Small level 4 nodes with normal fatty hila, up to 7 mm short axis.  IMPRESSION: Diffuse thyromegaly and marked heterogeneity of thyroid parenchyma, with no dominant nodule or mass. Query previous thyroiditis. No surrounding lymphadenopathy.   Electronically Signed   By: Lars Pinks M.D.   On: 08/16/2013 10:08   US Abdomen Limited Ruq  08/16/2013   CLINICAL DATA:  Elevated liver enzymes and thrombocytopenia; history previous cholecystectomy  EXAM: US ABDOMEN LIMITED - RIGHT UPPER QUADRANT  COMPARISON:  Noncontrast CT scan of the abdomen and pelvis dated April 01, 2003.  FINDINGS: Gallbladder:  The gallbladder surgically at  Common bile duct:  Diameter: By 0.3 mm.  Liver:  There is a well-defined anechoic focus in the left hepatic lobe measuring 2.2 x 1.9 x 2 point 1 cm. In the interlobar region there is a well-defined 2.7 x 1.8 x 2.7 cm anechoic structure consistent with a cyst. There is no intrahepatic ductal dilation. A focus of subtle increased echotexture in the left hepatic lobe measures 2.4 x 0.9 x 1.8 cm. There is no intrahepatic ductal dilation.  IMPRESSION: 1. There are well defined ovoid anechoic structures in the liver as described consistent with cysts. A subtle area of increased echotexture in the left hepatic lobe is demonstrated and is nonspecific but may reflect a hemangioma. Further evaluation of the liver with a hepatic protocol MRI would be useful. 2. The gallbladder is surgically absent. The common bile duct is  normal in diameter.   Electronically Signed   By: David  Martinique   On: 08/16/2013 09:45        Scheduled Meds: . azithromycin  250 mg Oral Daily  . ferrous sulfate  325 mg Oral TID WC  . fluticasone  1 spray Each Nare Daily  . heparin  5,000 Units Subcutaneous 3 times per day  . ipratropium  2 spray Each Nare QID  . levothyroxine  50 mcg Intravenous Daily  . multivitamin with minerals  1 tablet Oral Daily  . omega-3 acid ethyl esters  1 g Oral Daily  . pantoprazole  40 mg Oral Daily  . sodium chloride  3 mL Intravenous Q12H   Continuous Infusions: . sodium chloride 75 mL/hr at 08/16/13 0421    Principal Problem:   Myxedema Active Problems:   Goiter   Anemia   Hypothyroidism    Time spent: 45 minutes    Modena Jansky, MD, FACP, [facility]. [facility] Pager [phone]  If 7PM-7AM, please contact night-coverage [email or web address] Password [email or web address] 08/16/2013, 1:12 PM    LOS: 1 day

## 2013-08-16 NOTE — Progress Notes (Signed)
Echocardiogram 2D Echocardiogram has been performed.  Sharon Hensley 08/16/2013, 11:16 AM

## 2013-08-16 NOTE — Progress Notes (Signed)
*  PRELIMINARY RESULTS* Vascular Ultrasound Carotid Duplex (Doppler) has been completed.  Preliminary findings: Technically limited due to patient body habitus and breathing interference. Appears to be in the range of 1-39% ICA stenosis bilaterally.    Landry Mellow, RDMS, RVT  08/16/2013, 2:44 PM

## 2013-08-17 LAB — BASIC METABOLIC PANEL
BUN: 6 mg/dL (ref 6–23)
CO2: 33 mEq/L — ABNORMAL HIGH (ref 19–32)
CREATININE: 0.86 mg/dL (ref 0.50–1.10)
Calcium: 9.7 mg/dL (ref 8.4–10.5)
Chloride: 95 mEq/L — ABNORMAL LOW (ref 96–112)
GFR, EST NON AFRICAN AMERICAN: 79 mL/min — AB (ref >90)
Glucose, Bld: 123 mg/dL — ABNORMAL HIGH (ref 70–99)
Potassium: 3.5 mEq/L — ABNORMAL LOW (ref 3.7–5.3)
Sodium: 138 mEq/L (ref 137–147)

## 2013-08-17 LAB — URINALYSIS, ROUTINE W REFLEX MICROSCOPIC
Bilirubin Urine: NEGATIVE
Glucose, UA: NEGATIVE mg/dL
KETONES UR: NEGATIVE mg/dL
LEUKOCYTES UA: NEGATIVE
NITRITE: NEGATIVE
PH: 8 (ref 5.0–8.0)
Protein, ur: NEGATIVE mg/dL
Specific Gravity, Urine: 1.01 (ref 1.005–1.030)
UROBILINOGEN UA: 0.2 mg/dL (ref 0.0–1.0)

## 2013-08-17 LAB — THYROID PEROXIDASE ANTIBODY: THYROID PEROXIDASE ANTIBODY: 7768 [IU]/mL — AB (ref <35.0)

## 2013-08-17 LAB — T4, FREE: FREE T4: 0.38 ng/dL — AB (ref 0.80–1.80)

## 2013-08-17 LAB — URINE MICROSCOPIC-ADD ON

## 2013-08-17 MED ORDER — OXYMETAZOLINE HCL 0.05 % NA SOLN
1.0000 | Freq: Two times a day (BID) | NASAL | Status: DC
  Administered 2013-08-17 – 2013-08-20 (×6): 1 via NASAL
  Filled 2013-08-17: qty 15

## 2013-08-17 MED ORDER — LEVOTHYROXINE SODIUM 100 MCG IV SOLR
100.0000 ug | Freq: Every day | INTRAVENOUS | Status: DC
  Administered 2013-08-17 – 2013-08-19 (×3): 100 ug via INTRAVENOUS
  Filled 2013-08-17 (×4): qty 5

## 2013-08-17 MED ORDER — SALINE SPRAY 0.65 % NA SOLN
1.0000 | NASAL | Status: DC | PRN
  Administered 2013-08-17: 1 via NASAL
  Filled 2013-08-17: qty 44

## 2013-08-17 MED ORDER — ATROPINE SULFATE 0.1 MG/ML IJ SOLN: 1.0000 mg | INTRAMUSCULAR | Status: DC | PRN

## 2013-08-17 NOTE — Progress Notes (Signed)
Patient had a 5.04 second pause, HR 38 during pause. Vitals: temp. 98.1, BP 125/81, HR 78, Resp 18, Oxygen sat 97% on room air. No c/o pain, dizziness, or SOB. EKG showed Sinus brady with sinus arrythmia. Patient currently in sinus rhythm. Patient's HR has fluctuated between sinus brady and sinus rhythm during the night. HR ranging from 28-89. Baltazar Najjar, NP on call notified via text page. Will continue to monitor closely.

## 2013-08-17 NOTE — Consult Note (Signed)
`  Reason for Consult: Bradycardia  Requesting Physician: [place]algi  Cardiologist: None  HPI: This is a 48 y.o. female with a past medical history significant for anemia and a large goiter with clinical presentation of severe myxedema admitted for difficulty with her breathing as well as multiple symptoms of myxedema. She has never spent full-blown syncope, but did have some presyncopal complaints.  During her hospitalization she has had repeated problems with marked bradycardia, that has been asymptomatic. Review of her telemetry shows episodes of fairly abrupt bradycardia with gradual prolongation of the RR interval and pauses that have been as long as 4.5 seconds in duration. The QRS complexes always narrow. The mechanism for most of the pauses appears to be severe sinus bradycardia, although on one occasion I cannot exclude a brief episode of atrioventricular block. Most of the episodes occur at night or when the patient is having difficulty breathing. She has severe upper airway congestion and heavy secretions.  At home she has been using both saline spray and frequently using nasal decongestants.  PMHx:  Past Medical History  Diagnosis Date  . Goiter   . Anemia   . Temporary low platelet count    Past Surgical History  Procedure Laterality Date  . Cholecystectomy      FAMHx: Family History  Problem Relation Age of Onset  . Diabetes Mother   . Diabetes Other     SOCHx:  reports that she has quit smoking. She does not have any smokeless tobacco history on file. She reports that she does not drink alcohol or use illicit drugs.  ALLERGIES: Allergies  Allergen Reactions  . Clindamycin/Lincomycin Anaphylaxis  . Penicillins Hives and Itching  . Veetids [Penicillin V] Hives and Itching    ROS: Constitutional: positive for fatigue, fevers and sweats Eyes: negative Ears, nose, mouth, throat, and face: positive for hearing loss, hoarseness, nasal congestion,  snoring, sore throat and voice change, negative for epistaxis and tinnitus Respiratory: positive for cough and dyspnea on exertion, negative for hemoptysis, pleurisy/chest pain and wheezing Cardiovascular: positive for fatigue, lower extremity edema and near-syncope, negative for chest pain, claudication, orthopnea, paroxysmal nocturnal dyspnea and syncope Gastrointestinal: negative Genitourinary:negative Hematologic/lymphatic: negative for bleeding, easy bruising and petechiae Musculoskeletal:positive for arthralgias, muscle weakness and myalgias Neurological: negative Behavioral/Psych: positive for labile mood, low energy Endocrine: positive for temperature intolerance, negative for diabetic symptoms including polydipsia, polyphagia and polyuria Allergic/Immunologic: positive for nasal allergies  HOME MEDICATIONS: Prescriptions prior to admission  Medication Sig Dispense Refill  . azithromycin (ZITHROMAX) 250 MG tablet Take 1 tablet (250 mg total) by mouth daily. Take first 2 tablets together, then 1 every day until finished.  6 tablet  0  . COCONUT OIL PO Take 1 capsule by mouth daily.      Marland Kitchen doxycycline (VIBRA-TABS) 100 MG tablet Take 100 mg by mouth daily.      . famotidine-calcium carbonate-magnesium hydroxide (PEPCID COMPLETE) 10-800-165 MG CHEW chewable tablet Chew 1 tablet by mouth at bedtime as needed (heartburn).      . ferrous sulfate 325 (65 FE) MG tablet Take 325 mg by mouth 3 (three) times daily with meals.       . fluticasone (FLONASE) 50 MCG/ACT nasal spray Place 1 spray into both nostrils daily.       Marland Kitchen ipratropium (ATROVENT) 0.06 % nasal spray Place 2 sprays into both nostrils 4 (four) times daily.  15 mL  12  . Multiple Vitamin (MULTIVITAMIN WITH MINERALS) TABS tablet Take 1 tablet by  mouth daily.      Marland Kitchen omega-3 acid ethyl esters (LOVAZA) 1 G capsule Take 1 g by mouth daily.       Marland Kitchen omeprazole (PRILOSEC) 20 MG capsule Take 40 mg by mouth daily with breakfast.         HOSPITAL MEDICATIONS: Prior to Admission:  Prescriptions prior to admission  Medication Sig Dispense Refill  . azithromycin (ZITHROMAX) 250 MG tablet Take 1 tablet (250 mg total) by mouth daily. Take first 2 tablets together, then 1 every day until finished.  6 tablet  0  . COCONUT OIL PO Take 1 capsule by mouth daily.      Marland Kitchen doxycycline (VIBRA-TABS) 100 MG tablet Take 100 mg by mouth daily.      . famotidine-calcium carbonate-magnesium hydroxide (PEPCID COMPLETE) 10-800-165 MG CHEW chewable tablet Chew 1 tablet by mouth at bedtime as needed (heartburn).      . ferrous sulfate 325 (65 FE) MG tablet Take 325 mg by mouth 3 (three) times daily with meals.       . fluticasone (FLONASE) 50 MCG/ACT nasal spray Place 1 spray into both nostrils daily.       Marland Kitchen ipratropium (ATROVENT) 0.06 % nasal spray Place 2 sprays into both nostrils 4 (four) times daily.  15 mL  12  . Multiple Vitamin (MULTIVITAMIN WITH MINERALS) TABS tablet Take 1 tablet by mouth daily.      Marland Kitchen omega-3 acid ethyl esters (LOVAZA) 1 G capsule Take 1 g by mouth daily.       Marland Kitchen omeprazole (PRILOSEC) 20 MG capsule Take 40 mg by mouth daily with breakfast.       Scheduled: . azithromycin  250 mg Oral Daily  . ferrous sulfate  325 mg Oral TID WC  . fluticasone  1 spray Each Nare Daily  . heparin  5,000 Units Subcutaneous 3 times per day  . ipratropium  2 spray Each Nare QID  . multivitamin with minerals  1 tablet Oral Daily  . omega-3 acid ethyl esters  1 g Oral Daily  . oxymetazoline  1 spray Each Nare BID  . pantoprazole  40 mg Oral Daily  . sodium chloride  3 mL Intravenous Q12H    VITALS: Blood pressure 151/63, pulse 84, temperature 98.2 F (36.8 C), temperature source Oral, resp. rate 18, height 5\' 5"  (1.651 m), weight 223 lb (101.152 kg), last menstrual period 07/27/2013, SpO2 98.00%.  PHYSICAL EXAM:  General: Alert, oriented x3, no distress, grossly mixed edematous, hard of hearing Head: no evidence of trauma, PERRL,  EOMI, no exophtalmos or lid lag, no myxedema, no xanthelasma; normal ears, nose and oropharynx Neck: Unable to see jugular venous pulsations and no hepatojugular reflux; brisk carotid pulses without delay and no carotid bruits Chest: clear to auscultation, no signs of consolidation by percussion or palpation, normal fremitus, symmetrical and full respiratory excursions Cardiovascular: normal position and quality of the apical impulse, regular rhythm, normal first heart sound and normal second heart sound, no rubs or gallops, no murmur Abdomen: no tenderness or distention, no masses by palpation, no abnormal pulsatility or arterial bruits, normal bowel sounds, no hepatosplenomegaly Extremities: no clubbing, cyanosis;  no dependent edema; 2+ radial, ulnar and brachial pulses bilaterally; 2+ right femoral, posterior tibial and dorsalis pedis pulses; 2+ left femoral, posterior tibial and dorsalis pedis pulses; no subclavian or femoral bruits Neurological: grossly nonfocal other than hard of hearing  LABS  CBC  Recent Labs  08/15/13 2348 08/16/13 0435  WBC 11.6* 10.6*  NEUTROABS 8.5*  --   HGB 9.4* 9.2*  HCT 33.5* 32.2*  MCV 74.1* 73.3*  PLT 359 696   Basic Metabolic Panel  Recent Labs  08/16/13 0435 08/17/13 0520  NA 137 138  K 3.7 3.5*  CL 94* 95*  CO2 31 33*  GLUCOSE 103* 123*  BUN 8 6  CREATININE 0.92 0.86  CALCIUM 9.9 9.7   Liver Function Tests  Recent Labs  08/15/13 2348 08/16/13 0435  AST 76* 70*  ALT 63* 59*  ALKPHOS 72 68  BILITOT <0.2* <0.2*  PROT 8.3 7.6  ALBUMIN 4.4 4.0   No results found for this basename: LIPASE, AMYLASE,  in the last 72 hours Cardiac Enzymes  Recent Labs  08/16/13 0435 08/16/13 1158 08/16/13 1745  TROPONINI <0.30 <0.30 <0.30   Thyroid Function Tests  Recent Labs  08/16/13 0002  TSH >100.000*    IMAGING: Dg Neck Soft Tissue  08/16/2013   CLINICAL DATA:  ORAL SWELLING  Is it is okay 04/05/2001  EXAM: NECK SOFT TISSUES -  1+ VIEW  COMPARISON:  CT NECK W/CM dated 08/12/2013  FINDINGS: When correlated with prior CT there has been no significant change. There is prominence in the palatine tonsils and adenoid regions. No acute osseous abnormalities.  IMPRESSION: Prominence in the palatine tonsils and adenoid regions.   Electronically Signed   By: Margaree Mackintosh M.D.   On: 08/16/2013 00:20   US Soft Tissue Head/neck  08/16/2013   CLINICAL DATA:  48 year old female with goiter. Neck mass. Initial encounter.  EXAM: THYROID ULTRASOUND  TECHNIQUE: Ultrasound examination of the thyroid gland and adjacent soft tissues was performed.  COMPARISON:  Neck CT 08/12/2013.  FINDINGS: Right thyroid lobe  Measurements: 6.2 x 3.0 x 3.9 cm. Diffusely heterogeneous echotexture. No discrete nodule.  Left thyroid lobe  Measurements: 6.5 x 3.1 x 3.2 cm. Diffusely heterogeneous echotexture, lobe replaced by diffusely nodular parenchyma (image 24). No dominant nodule.  Isthmus  Thickness: 2 mm.  No nodules visualized.  Lymphadenopathy  Small level 4 nodes with normal fatty hila, up to 7 mm short axis.  IMPRESSION: Diffuse thyromegaly and marked heterogeneity of thyroid parenchyma, with no dominant nodule or mass. Query previous thyroiditis. No surrounding lymphadenopathy.   Electronically Signed   By: Lars Pinks M.D.   On: 08/16/2013 10:08   US Abdomen Limited Ruq  08/16/2013   CLINICAL DATA:  Elevated liver enzymes and thrombocytopenia; history previous cholecystectomy  EXAM: US ABDOMEN LIMITED - RIGHT UPPER QUADRANT  COMPARISON:  Noncontrast CT scan of the abdomen and pelvis dated April 01, 2003.  FINDINGS: Gallbladder:  The gallbladder surgically at  Common bile duct:  Diameter: By 0.3 mm.  Liver:  There is a well-defined anechoic focus in the left hepatic lobe measuring 2.2 x 1.9 x 2 point 1 cm. In the interlobar region there is a well-defined 2.7 x 1.8 x 2.7 cm anechoic structure consistent with a cyst. There is no intrahepatic ductal dilation. A  focus of subtle increased echotexture in the left hepatic lobe measures 2.4 x 0.9 x 1.8 cm. There is no intrahepatic ductal dilation.  IMPRESSION: 1. There are well defined ovoid anechoic structures in the liver as described consistent with cysts. A subtle area of increased echotexture in the left hepatic lobe is demonstrated and is nonspecific but may reflect a hemangioma. Further evaluation of the liver with a hepatic protocol MRI would be useful. 2. The gallbladder is surgically absent. The common bile duct is normal in diameter.  Electronically Signed   By: David  Martinique   On: 08/16/2013 09:45   Echocardiogram - Left ventricle: The cavity size was normal. Wall thickness was normal. Systolic function was normal. The estimated ejection fraction was in the range of 55% to 60%. Wall motion was normal; there were no regional wall motion abnormalities. Doppler parameters are consistent with abnormal left ventricular relaxation (grade 1 diastolic dysfunction).   ECG: Sinus bradycardia, prominent sinus arrhythmia, normal PR interval with a narrow QRS complex  TELEMETRY:  described in history of present illness  IMPRESSION: 1. Intermittent severe sinus bradycardia likely related to intermittent episodes of airway obstruction related to obstructive sleep apnea, myxedema, goiter, crowded upper airway and increased upper airway secretions, in the setting of severe hypothyroidism 2. No evidence of AV node disease. 3. Allergic rhinitis with probable superimposed medication-induced vasomotor rhinitis  RECOMMENDATION: 1. Pacemaker implantation is not indicated at this time 2. The bradycardia is expected to respond to atropine should she become symptomatic. 3. Reevaluate after treatment of thyroid disorder 4. Strong suspicion for superimposed obstruction of the upper airway with or without sleep apnea 5. Consider ENT consultation for upper airway obstruction  Time Spent Directly with Patient: 45  minutes  Sanda Klein, MD, [facility] [phone] office [phone] pager   08/17/2013, 1:20 PM

## 2013-08-17 NOTE — Progress Notes (Signed)
Subjective: Sharon Hensley notes, "I feel better".  She feels better today than she has in recent months.  She had a bowel movement.    Review of systems:  No palpitations or tremors.    Objective: Vital signs in last 24 hours: Temp:  [98.1 F (36.7 C)-98.5 F (36.9 C)] 98.5 F (36.9 C) (05/20 1506) Pulse Rate:  [74-84] 74 (05/20 1506) Resp:  [16-18] 16 (05/20 1506) BP: (123-151)/(63-81) 123/70 mmHg (05/20 1506) SpO2:  [97 %-98 %] 97 % (05/20 1506) Weight:  [101.152 kg (223 lb)] 101.152 kg (223 lb) (05/20 0547) Weight change: -0.272 kg (-9.6 oz) Last BM Date: 08/14/13  Intake/Output from previous day: 05/19 0701 - 05/20 0700 In: 1486.8 [P.O.:960; I.V.:526.8] Out: 1575 [Urine:1575] Intake/Output this shift: Total I/O In: 483 [P.O.:480; I.V.:3] Out: -   Physical Exam: General: No acute distress.  Head/ears/nose/throat:  Muffled voice.   Eyes: Anicteric, no scleral show, periorbital puffiness, no lid lag.  Neck: Supple, trachea midline.  Thyroid: symmetric enlargement. Cardiovascular: Regular rhythm and rate, no murmur, normal radial pulses.  Respiratory: Normal respiratory effort, clear to auscultation.  Gastrointestinal: Normal pitch bowel sounds, nontender abdomen without distention or appreciable hepatomegaly.  Neurologic: Cranial nerves normal as tested except hearing loss, generalized hyporeflexia, no tremor.  Musculoskeletal: Normal muscle tone, no muscle atrophy.  Skin: better warmth compared to previous exam, no visible rash.  Mental status: affect better compared to previous exam, congruent with mood, no hallucinations or delusions evident.  Hematologic/lymphatic: No cervical adenopathy, no jaundice.   Lab Results:  Lab Results  Component Value Date   FREET4 0.38* 08/17/2013   Thyroid peroxidase antibody: positive.    Medications: I have reviewed the patient's current medications.  Assessment/Plan: 1.  Hashimoto's thyroiditis with severe hypothyroidism and  goiter.  Her voice, breathing, and sleep apnea should greatly improve within the next several weeks during levothyroxine replacement therapy.  Adequate levothyroxine may also reduce the size of her thyroid gland.    Her free T4 level, symptoms, and signs are beginning to improve after the loading dose of intravenous levothyroxine yesterday.   Continue current plan: 1.  Levothyroxine 100 micrograms intravenous today and tomorrow. 2.  Anticipate transition to levothyroxine 150 micrograms by mouth once a day starting as early as Saturday, Aug 20, 2013.      LOS: 2 days   Delrae Rend 08/17/2013, 6:33 PM

## 2013-08-17 NOTE — Progress Notes (Signed)
PROGRESS NOTE    Branden Ferrar [id number] DOB: [birth year] DOA: 08/15/2013 PCP: Tamsen Roers, MD  HPI/Brief narrative 48 year old female with history of recent frequent ED visits for upper respiratory tract symptoms, attributed to sinusitis-treated with antibiotics, then for possible tonsillitis- treated with antibiotics and steroids, developed a reaction to steroids (hallucinations and nightmares)-which were attributed to prednisone but antibiotics were changed from clindamycin to azithromycin at patient's request, presented to ED again on 5/18 with complaints of swelling sensation in the throat, difficulty breathing and questionable syncopal episode while sitting. She was recently diagnosed at an urgent care with goiter and has been having 4-6 weeks history of generalized weakness, fatigue, exhaustion, throat swelling sensation, weight gain, cold intolerance, constipation and symptoms have been progressively getting worse. She was referred to see Dr. Delrae Rend but was unable to get an early appointment. She had an appointment to see ENT today. TSH drawn a few days ago > 100  Assessment/Plan:  1. 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 Patient was admitted to telemetry. Started on IV Synthroid. Neck ultrasound shows diffuse thyromegaly and marked heterogeneity of thyroid parenchyma with no dominant nodule or mass-? Previous thyroiditis and no surrounding lymphadenopathy. Patient's upper airway symptoms may be secondary to goiter, soft tissue swelling from hypothyroid/Myxedema & possible OSA. No features of acute airway compromise. Dr. Buddy Duty, endocrinology input appreciated-suspect Hashimoto's thyroiditis. Patient received IV levothyroxin 200 mcg on 5/19. Continue IV levothyroxin 100 mcg daily on 5/20 and 5/21. Transition to PO levothyroxine 150 daily from 5/22 based on response off IV thyroxine.  Follow daily free T4 to monitor her response to replacement treatment. 2. Severe Sinus bradycardia and sinus  pauses (both intermittent): Most likely secondary to problem #1 and need to rule out OSA. Continue monitoring on telemetry. Avoid negative chronotropic medications. 2-D echo shows normal EF. Cardiology input appreciated-no pacemaker indicated at this time, when necessary atropine should she become symptomatic, reassess after replacement treatment 3. ? Syncope: 2-D echo with normal EF. Telemetry shows intermittent sinus bradycardia in the 20-30s and intermittent prolonged pauses up to 4.5 seconds. Orthostatic blood pressures negative. Carotid Dopplers: 1-39% ICA stenosis bilaterally. Echo with normal EF.? Due to problem #1 or severe sinus bradycardia related to problem #1. 4. Minimal transaminitis: No GI symptoms. May be secondary to hypothyroid. Liver ultrasound shows possible cyst and left lobe? Hemangioma-consider MRI. Acute hepatitis panel negative. 5. Iron deficiency/Microcytic anemia: Stable. Will need outpatient workup. Continue iron supplements. 6. Morbid obesity/rule out OSA: As per patient, history of snoring and apneic spells where her partner gets concerned and wakes her up. Will need formal outpatient sleep study. No stridor or features of airway compromise. Monitor closely. Upper airway features likely to improve with treatment of myxedema. No indication for urgent ENT consultation. 7. Hard of hearing &? Left ear drainage: Patient denies earache but has difficulty hearing-long-standing. Outpatient ENT consultation.   Code Status: Full Family Communication: Discussed with patient's partner Ms. Lyndal Pulley on 5/19 Disposition Plan: Remains inpatient. Home when medically stable.   Consultants:  Endocrinology: Dr. Delrae Rend  Cardiology  Procedures:  None  Antibiotics:  None   Subjective: Feels much better-states that she has more energy, improved appetite, slept better overnight and improved breathing. States that she might be having onset of her  menstruation.  Objective: Filed Vitals:   08/16/13 0941 08/16/13 2117 08/16/13 2323 08/17/13 0547  BP: 129/76 135/78 125/81 151/63  Pulse: 77 78 76 84  Temp: 98.2 F (36.8 C) 98.3 F (36.8 C) 98.1 F (36.7 C) 98.2  F (36.8 C)  TempSrc: Oral Oral Oral Oral  Resp: 18 18 18 18   Height:      Weight:    101.152 kg (223 lb)  SpO2: 95% 98% 97% 98%    Intake/Output Summary (Last 24 hours) at 08/17/13 1436 Last data filed at 08/17/13 1610  Gross per 24 hour  Intake    723 ml  Output    800 ml  Net    -77 ml   Filed Weights   08/16/13 0421 08/17/13 0547  Weight: 101.424 kg (223 lb 9.6 oz) 101.152 kg (223 lb)     Exam:  General exam: Pleasant young female, moderately built and morbidly obese sitting up in bed without any distress. Puffiness of face. Thick. neck. Hoarce voice ENT: Thick neck. Large tongue, unable to visualize posterior pharyngeal wall or tonsils. Otoscopic exam of bilateral auditory canals:? Cerumen/?? Drainage left ear and cerumen right ear-unable to see either TM clearly. Decreased hearing bilaterally Respiratory system: Clear. No increased work of breathing. Cardiovascular system: S1 & S2 heard, RRR. No JVD, murmurs, gallops, clicks. Trace bilateral ankle edema. Telemetry: Mostly sinus rhythm but has periodic severe sinus bradycardia in the 20s-30s with occasional sinus pauses as long as 4.5 seconds. Gastrointestinal system: Abdomen is nondistended, soft and nontender. Normal bowel sounds heard. Central nervous system: Alert and oriented. No focal neurological deficits. Extremities: Symmetric 5 x 5 power.   Data Reviewed: Basic Metabolic Panel:  Recent Labs Lab 08/12/13 2100 08/12/13 2101 08/15/13 2348 08/16/13 0435 08/17/13 0520  NA 137 136* 133* 137 138  K 3.7 3.4* 3.7 3.7 3.5*  CL 94* 95* 90* 94* 95*  CO2 31  --  32 31 33*  GLUCOSE 90 91 107* 103* 123*  BUN 9 7 9 8 6   CREATININE 1.01 1.10 1.07 0.92 0.86  CALCIUM 11.3*  --  10.3 9.9 9.7   Liver  Function Tests:  Recent Labs Lab 08/12/13 2100 08/15/13 2348 08/16/13 0435  AST 94* 76* 70*  ALT 61* 63* 59*  ALKPHOS 71 72 68  BILITOT 0.2* <0.2* <0.2*  PROT 8.4* 8.3 7.6  ALBUMIN 4.4 4.4 4.0   No results found for this basename: LIPASE, AMYLASE,  in the last 168 hours No results found for this basename: AMMONIA,  in the last 168 hours CBC:  Recent Labs Lab 08/12/13 2100 08/12/13 2101 08/15/13 2348 08/16/13 0435  WBC 10.8*  --  11.6* 10.6*  NEUTROABS 7.9*  --  8.5*  --   HGB 8.8* 11.9* 9.4* 9.2*  HCT 32.1* 35.0* 33.5* 32.2*  MCV 71.5*  --  74.1* 73.3*  PLT 308  --  359 317   Cardiac Enzymes:  Recent Labs Lab 08/16/13 0435 08/16/13 1158 08/16/13 1745  TROPONINI <0.30 <0.30 <0.30   BNP (last 3 results) No results found for this basename: PROBNP,  in the last 8760 hours CBG: No results found for this basename: GLUCAP,  in the last 168 hours  Recent Results (from the past 240 hour(s))  RAPID STREP SCREEN     Status: None   Collection Time    08/12/13 11:06 PM      Result Value Ref Range Status   Streptococcus, Group A Screen (Direct) NEGATIVE  NEGATIVE Final   Comment: (NOTE)     A Rapid Antigen test may result negative if the antigen level in the     sample is below the detection level of this test. The FDA has not     cleared this  test as a stand-alone test therefore the rapid antigen     negative result has reflexed to a Group A Strep culture.  CULTURE, GROUP A STREP     Status: None   Collection Time    08/12/13 11:06 PM      Result Value Ref Range Status   Specimen Description THROAT   Final   Special Requests NONE   Final   Culture     Final   Value: No Beta Hemolytic Streptococci Isolated     Performed at [facility]   Report Status 08/14/2013 FINAL   Final      Additional labs: 1. Anemia panel: Iron 25, TIBC 343, saturation ratio 7, ferritin 32, folate 16 and B12: 553, reticulocytes 107.5 2. Urine pregnancy test: Negative 3. TSH  >100, free T4 0.22 on 5/19. Free T4 0.25 and free T3 less than 0.3 on 5/15 4. Acute hepatitis panel: Negative 5. 2-D echo 08/16/13: Study Conclusions  - Left ventricle: The cavity size was normal. Wall thickness was normal. Systolic function was normal. The estimated ejection fraction was in the range of 55% to 60%. Wall motion was normal; there were no regional wall motion abnormalities.       Studies: Dg Neck Soft Tissue  08/16/2013   CLINICAL DATA:  ORAL SWELLING  Is it is okay 04/05/2001  EXAM: NECK SOFT TISSUES - 1+ VIEW  COMPARISON:  CT NECK W/CM dated 08/12/2013  FINDINGS: When correlated with prior CT there has been no significant change. There is prominence in the palatine tonsils and adenoid regions. No acute osseous abnormalities.  IMPRESSION: Prominence in the palatine tonsils and adenoid regions.   Electronically Signed   By: Margaree Mackintosh M.D.   On: 08/16/2013 00:20   US Soft Tissue Head/neck  08/16/2013   CLINICAL DATA:  48 year old female with goiter. Neck mass. Initial encounter.  EXAM: THYROID ULTRASOUND  TECHNIQUE: Ultrasound examination of the thyroid gland and adjacent soft tissues was performed.  COMPARISON:  Neck CT 08/12/2013.  FINDINGS: Right thyroid lobe  Measurements: 6.2 x 3.0 x 3.9 cm. Diffusely heterogeneous echotexture. No discrete nodule.  Left thyroid lobe  Measurements: 6.5 x 3.1 x 3.2 cm. Diffusely heterogeneous echotexture, lobe replaced by diffusely nodular parenchyma (image 24). No dominant nodule.  Isthmus  Thickness: 2 mm.  No nodules visualized.  Lymphadenopathy  Small level 4 nodes with normal fatty hila, up to 7 mm short axis.  IMPRESSION: Diffuse thyromegaly and marked heterogeneity of thyroid parenchyma, with no dominant nodule or mass. Query previous thyroiditis. No surrounding lymphadenopathy.   Electronically Signed   By: Lars Pinks M.D.   On: 08/16/2013 10:08   US Abdomen Limited Ruq  08/16/2013   CLINICAL DATA:  Elevated liver enzymes and  thrombocytopenia; history previous cholecystectomy  EXAM: US ABDOMEN LIMITED - RIGHT UPPER QUADRANT  COMPARISON:  Noncontrast CT scan of the abdomen and pelvis dated April 01, 2003.  FINDINGS: Gallbladder:  The gallbladder surgically at  Common bile duct:  Diameter: By 0.3 mm.  Liver:  There is a well-defined anechoic focus in the left hepatic lobe measuring 2.2 x 1.9 x 2 point 1 cm. In the interlobar region there is a well-defined 2.7 x 1.8 x 2.7 cm anechoic structure consistent with a cyst. There is no intrahepatic ductal dilation. A focus of subtle increased echotexture in the left hepatic lobe measures 2.4 x 0.9 x 1.8 cm. There is no intrahepatic ductal dilation.  IMPRESSION: 1. There are well defined ovoid  anechoic structures in the liver as described consistent with cysts. A subtle area of increased echotexture in the left hepatic lobe is demonstrated and is nonspecific but may reflect a hemangioma. Further evaluation of the liver with a hepatic protocol MRI would be useful. 2. The gallbladder is surgically absent. The common bile duct is normal in diameter.   Electronically Signed   By: David  Martinique   On: 08/16/2013 09:45        Scheduled Meds: . azithromycin  250 mg Oral Daily  . ferrous sulfate  325 mg Oral TID WC  . fluticasone  1 spray Each Nare Daily  . heparin  5,000 Units Subcutaneous 3 times per day  . ipratropium  2 spray Each Nare QID  . multivitamin with minerals  1 tablet Oral Daily  . omega-3 acid ethyl esters  1 g Oral Daily  . oxymetazoline  1 spray Each Nare BID  . pantoprazole  40 mg Oral Daily  . sodium chloride  3 mL Intravenous Q12H   Continuous Infusions:    Principal Problem:   Myxedema Active Problems:   Goiter   Anemia   Hypothyroidism   Sinus bradycardia   Syncope    Time spent: 45 minutes    Modena Jansky, MD, FACP, [facility]. [facility] Pager [phone]  If 7PM-7AM, please contact night-coverage [email or web address] Password [email or web address] 08/17/2013,  2:36 PM    LOS: 2 days

## 2013-08-17 NOTE — Progress Notes (Addendum)
Observed blood in pt's urine. No c/o of burning or pain while urinating. No c/o pain or discomfort in abdomen.  Sharon Najjar, NP on call notified via text page. New order placed. Will continue to monitor.

## 2013-08-18 LAB — CBC
HCT: 33.9 % — ABNORMAL LOW (ref 36.0–46.0)
Hemoglobin: 9.4 g/dL — ABNORMAL LOW (ref 12.0–15.0)
MCH: 21.9 pg — ABNORMAL LOW (ref 26.0–34.0)
MCHC: 28.6 g/dL — AB (ref 30.0–36.0)
MCV: 76.7 fL — AB (ref 78.0–100.0)
Platelets: 369 10*3/uL (ref 150–400)
RBC: 4.42 MIL/uL (ref 3.87–5.11)
WBC: 10.4 10*3/uL (ref 4.0–10.5)

## 2013-08-18 LAB — COMPREHENSIVE METABOLIC PANEL
ALT: 49 U/L — ABNORMAL HIGH (ref 0–35)
AST: 44 U/L — ABNORMAL HIGH (ref 0–37)
Albumin: 4.1 g/dL (ref 3.5–5.2)
Alkaline Phosphatase: 71 U/L (ref 39–117)
BUN: 7 mg/dL (ref 6–23)
CO2: 31 meq/L (ref 19–32)
Calcium: 9.7 mg/dL (ref 8.4–10.5)
Chloride: 90 mEq/L — ABNORMAL LOW (ref 96–112)
Creatinine, Ser: 0.85 mg/dL (ref 0.50–1.10)
GFR calc Af Amer: >90 mL/min (ref >90)
GFR, EST NON AFRICAN AMERICAN: 80 mL/min — AB (ref >90)
Glucose, Bld: 104 mg/dL — ABNORMAL HIGH (ref 70–99)
Potassium: 3.9 mEq/L (ref 3.7–5.3)
Sodium: 133 mEq/L — ABNORMAL LOW (ref 137–147)
TOTAL PROTEIN: 7.9 g/dL (ref 6.0–8.3)
Total Bilirubin: 0.2 mg/dL — ABNORMAL LOW (ref 0.3–1.2)

## 2013-08-18 LAB — ACTH STIMULATION, 3 TIME POINTS
CORTISOL BASE: 35.7 ug/dL
Cortisol, 30 Min: 37.7 ug/dL (ref >20.0)
Cortisol, 60 Min: 41.9 ug/dL (ref >20)

## 2013-08-18 LAB — T4, FREE: Free T4: 0.38 ng/dL — ABNORMAL LOW (ref 0.80–1.80)

## 2013-08-18 NOTE — Progress Notes (Signed)
Heart rate has improved. Agree with Dr. Lurline Del note regarding etiology, with severe hypothyroidism being the main driving factor. Nothing further to add at this time. Will sign-off.  Please call with questions.  Pixie Casino, MD, [facility] Attending Cardiologist [place]

## 2013-08-18 NOTE — Progress Notes (Signed)
PROGRESS NOTE    Sharon Hensley XIP:[id number] DOB: [birth year] DOA: 08/15/2013 PCP: Tamsen Roers, MD  HPI/Brief narrative 48 year old female with history of recent frequent ED visits for upper respiratory tract symptoms, attributed to sinusitis-treated with antibiotics, then for possible tonsillitis- treated with antibiotics and steroids, developed a reaction to steroids (hallucinations and nightmares)-which were attributed to prednisone but antibiotics were changed from clindamycin to azithromycin at patient's request, presented to ED again on 5/18 with complaints of swelling sensation in the throat, difficulty breathing and questionable syncopal episode while sitting. She was recently diagnosed at an urgent care with goiter and has been having 4-6 weeks history of generalized weakness, fatigue, exhaustion, throat swelling sensation, weight gain, cold intolerance, constipation and symptoms have been progressively getting worse. She was referred to see Dr. Delrae Rend but was unable to get an early appointment. She had an appointment to see ENT today. TSH drawn a few days ago > 100  Assessment/Plan:  1. 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 Patient was admitted to telemetry. Started on IV Synthroid. Neck ultrasound shows diffuse thyromegaly and marked heterogeneity of thyroid parenchyma with no dominant nodule or mass-? Previous thyroiditis and no surrounding lymphadenopathy. Patient's upper airway symptoms may be secondary to goiter, soft tissue swelling from hypothyroid/Myxedema & possible OSA. No features of acute airway compromise. Dr. Buddy Duty, endocrinology input appreciated-suspect Hashimoto's thyroiditis. Patient received IV levothyroxin 200 mcg on 5/19. Continue IV levothyroxin 100 mcg daily on 5/20 and 5/21. Transition to PO levothyroxine 150 daily from 5/22 based on response off IV thyroxine.  Follow daily free T4 to monitor her response to replacement treatment. 2. Severe Sinus bradycardia and sinus  pauses (both intermittent): Most likely secondary to problem #1 and need to rule out OSA. Continue monitoring on telemetry. Avoid negative chronotropic medications. 2-D echo shows normal EF. Cardiology input appreciated-no pacemaker indicated at this time, when necessary atropine should she become symptomatic, reassess after replacement treatment. Cardiology has signed off. 3. ? Syncope: 2-D echo with normal EF. Telemetry shows intermittent sinus bradycardia in the 20-30s and intermittent prolonged pauses up to 4.5 seconds. Orthostatic blood pressures negative. Carotid Dopplers: 1-39% ICA stenosis bilaterally. Echo with normal EF.? Due to problem #1 or severe sinus bradycardia related to problem #1. 4. Minimal transaminitis: No GI symptoms. May be secondary to hypothyroid. Liver ultrasound shows possible cyst and left lobe? Hemangioma-consider MRI. Acute hepatitis panel negative. 5. Iron deficiency/Microcytic anemia: Stable. Will need outpatient workup. Continue iron supplements. 6. Morbid obesity/rule out OSA: As per patient, history of snoring and apneic spells where her partner gets concerned and wakes her up. Will need formal outpatient sleep study. No stridor or features of airway compromise. Monitor closely. Upper airway features likely to improve with treatment of myxedema. No indication for urgent ENT consultation.    Code Status: Full Family Communication: No family at bedside Disposition Plan: Remains inpatient. Home when medically stable.   Consultants:  Endocrinology: Dr. Delrae Rend  Cardiology  Procedures:  None  Antibiotics:  None   Subjective: Patient seen and examined, complains of difficulty swallowing.  Objective: Filed Vitals:   08/17/13 0547 08/17/13 1506 08/17/13 2021 08/18/13 0505  BP: 151/63 123/70 143/66 147/76  Pulse: 84 74 86 84  Temp: 98.2 F (36.8 C) 98.5 F (36.9 C) 98.5 F (36.9 C) 97.8 F (36.6 C)  TempSrc: Oral Oral Oral Oral  Resp: 18 16 18  20   Height:      Weight: 101.152 kg (223 lb)   101.5 kg (223 lb 12.3 oz)  SpO2: 98% 97% 98% 97%    Intake/Output Summary (Last 24 hours) at 08/18/13 1023 Last data filed at 08/18/13 0900  Gross per 24 hour  Intake    360 ml  Output      0 ml  Net    360 ml   Filed Weights   08/16/13 0421 08/17/13 0547 08/18/13 0505  Weight: 101.424 kg (223 lb 9.6 oz) 101.152 kg (223 lb) 101.5 kg (223 lb 12.3 oz)     Exam:  Physical Exam: Head: Normocephalic, atraumatic.  Eyes: No signs of jaundice, EOMI Nose: Mucous membranes dry.  Throat: Oropharynx edematous, thick tongue Neck: supple,No deformities, thyroid enlargement Lungs: Normal respiratory effort. B/L Clear to auscultation, no crackles or wheezes.  Heart: Regular RR. S1 and S2 normal  Abdomen: BS normoactive. Soft, Nondistended, non-tender.  Extremities: No pretibial edema, no erythema    Data Reviewed: Basic Metabolic Panel:  Recent Labs Lab 08/12/13 2100 08/12/13 2101 08/15/13 2348 08/16/13 0435 08/17/13 0520 08/18/13 0410  NA 137 136* 133* 137 138 133*  K 3.7 3.4* 3.7 3.7 3.5* 3.9  CL 94* 95* 90* 94* 95* 90*  CO2 31  --  32 31 33* 31  GLUCOSE 90 91 107* 103* 123* 104*  BUN 9 7 9 8 6 7   CREATININE 1.01 1.10 1.07 0.92 0.86 0.85  CALCIUM 11.3*  --  10.3 9.9 9.7 9.7   Liver Function Tests:  Recent Labs Lab 08/12/13 2100 08/15/13 2348 08/16/13 0435 08/18/13 0410  AST 94* 76* 70* 44*  ALT 61* 63* 59* 49*  ALKPHOS 71 72 68 71  BILITOT 0.2* <0.2* <0.2* 0.2*  PROT 8.4* 8.3 7.6 7.9  ALBUMIN 4.4 4.4 4.0 4.1   No results found for this basename: LIPASE, AMYLASE,  in the last 168 hours No results found for this basename: AMMONIA,  in the last 168 hours CBC:  Recent Labs Lab 08/12/13 2100 08/12/13 2101 08/15/13 2348 08/16/13 0435 08/18/13 0410  WBC 10.8*  --  11.6* 10.6* 10.4  NEUTROABS 7.9*  --  8.5*  --   --   HGB 8.8* 11.9* 9.4* 9.2* 9.4*  HCT 32.1* 35.0* 33.5* 32.2* 33.9*  MCV 71.5*  --  74.1* 73.3*  76.7*  PLT 308  --  359 317 369   Cardiac Enzymes:  Recent Labs Lab 08/16/13 0435 08/16/13 1158 08/16/13 1745  TROPONINI <0.30 <0.30 <0.30   BNP (last 3 results) No results found for this basename: PROBNP,  in the last 8760 hours CBG: No results found for this basename: GLUCAP,  in the last 168 hours  Recent Results (from the past 240 hour(s))  RAPID STREP SCREEN     Status: None   Collection Time    08/12/13 11:06 PM      Result Value Ref Range Status   Streptococcus, Group A Screen (Direct) NEGATIVE  NEGATIVE Final   Comment: (NOTE)     A Rapid Antigen test may result negative if the antigen level in the     sample is below the detection level of this test. The FDA has not     cleared this test as a stand-alone test therefore the rapid antigen     negative result has reflexed to a Group A Strep culture.  CULTURE, GROUP A STREP     Status: None   Collection Time    08/12/13 11:06 PM      Result Value Ref Range Status   Specimen Description THROAT   Final  Special Requests NONE   Final   Culture     Final   Value: No Beta Hemolytic Streptococci Isolated     Performed at [facility]   Report Status 08/14/2013 FINAL   Final      Additional labs: 1. Anemia panel: Iron 25, TIBC 343, saturation ratio 7, ferritin 32, folate 16 and B12: 553, reticulocytes 107.5 2. Urine pregnancy test: Negative 3. TSH >100, free T4 0.22 on 5/19. Free T4 0.25 and free T3 less than 0.3 on 5/15 4. Acute hepatitis panel: Negative 5. 2-D echo 08/16/13: Study Conclusions  - Left ventricle: The cavity size was normal. Wall thickness was normal. Systolic function was normal. The estimated ejection fraction was in the range of 55% to 60%. Wall motion was normal; there were no regional wall motion abnormalities.       Studies: No results found.      Scheduled Meds: . azithromycin  250 mg Oral Daily  . ferrous sulfate  325 mg Oral TID WC  . fluticasone  1 spray Each Nare  Daily  . heparin  5,000 Units Subcutaneous 3 times per day  . ipratropium  2 spray Each Nare QID  . levothyroxine  100 mcg Intravenous Daily  . multivitamin with minerals  1 tablet Oral Daily  . omega-3 acid ethyl esters  1 g Oral Daily  . oxymetazoline  1 spray Each Nare BID  . pantoprazole  40 mg Oral Daily  . sodium chloride  3 mL Intravenous Q12H   Continuous Infusions:    Principal Problem:   Myxedema Active Problems:   Goiter   Anemia   Hypothyroidism   Sinus bradycardia   Syncope    Time spent: 25 minutes    Oswald Hillock, MD. [facility] Pager [phone]  If 7PM-7AM, please contact night-coverage [email or web address] Password [email or web address] 08/18/2013, 10:23 AM    LOS: 3 days

## 2013-08-18 NOTE — Progress Notes (Signed)
Pt had a pause episode lasting 8.90 seconds at 2050. K. Schorr notified. No new orders given. Told to keep monitoring and update with any additional pauses. Patient remains asymptomatic. VSS. Will continue to monitor closely. Blanchard Kelch, RN

## 2013-08-18 NOTE — Progress Notes (Addendum)
Subjective: Aerin feels "much better" overall.  However, she has a sore throat.  Objective: Vital signs in last 24 hours: Temp:  [97.8 F (36.6 C)-98.5 F (36.9 C)] 97.8 F (36.6 C) (05/21 0505) Pulse Rate:  [74-86] 84 (05/21 0505) Resp:  [16-20] 20 (05/21 0505) BP: (123-147)/(66-76) 147/76 mmHg (05/21 0505) SpO2:  [97 %-98 %] 97 % (05/21 0505) Weight:  [101.5 kg (223 lb 12.3 oz)] 101.5 kg (223 lb 12.3 oz) (05/21 0505) Weight change: 0.348 kg (12.3 oz) Last BM Date: 08/18/13  Intake/Output from previous day: 05/20 0701 - 05/21 0700 In: 483 [P.O.:480; I.V.:3] Out: -  Intake/Output this shift: Total I/O In: 120 [P.O.:120] Out: -   Physical Exam: General: No acute distress.  Head/ears/nose/throat:  Muffled voice, but understandable.   Eyes: Anicteric, no scleral show, periorbital puffiness, no lid lag.  Neck: Supple, trachea midline.  Thyroid: symmetric enlargement. Cardiovascular: Regular rhythm and rate, no murmur, normal radial pulses.  Respiratory: Normal respiratory effort, clear to auscultation.  Gastrointestinal: Normal pitch bowel sounds, nontender abdomen without distention or appreciable hepatomegaly.  Neurologic: Cranial nerves normal as tested except hearing loss, generalized hyporeflexia, no tremor.  Musculoskeletal: Normal muscle tone, no muscle atrophy.  Skin: better warmth compared to initial exam, no visible rash.  Mental status: affect better compared to initial exam, congruent with mood, no hallucinations or delusions evident.  Hematologic/lymphatic: No cervical adenopathy, no jaundice.   Lab Results: Lab Results  Component Value Date   FREET4 0.38* 08/18/2013   Thyroid peroxidase antibody: positive.    ACTH (Cosyntropin) stimulation test: normal cortisol response.    Medications: I have reviewed the patient's current medications.  Assessment/Plan: 1.  Hashimoto's thyroiditis with severe hypothyroidism and goiter.  No lab evidence of central or  primary adrenal insufficiency.    Her voice, breathing, and sleep apnea should greatly improve within the next several weeks during levothyroxine replacement therapy.  Adequate levothyroxine may also reduce the size of her thyroid gland.    Her free T4 level, symptoms, and signs are beginning to improve after the moderate loading doses of intravenous levothyroxine.   Continue current plan: 1.  Levothyroxine 100 micrograms intravenous given today and planned for tomorrow. 2.  Transition to levothyroxine 150 micrograms by mouth once a day on Saturday, Aug 20, 2013.   Dorna agreed to an office visit with me at 8:15AM on Wednesday, August 31, 2013.    After the dose of intravenous levothyroxine tomorrow, discharge planning will be at the discretion of the hospitalist.        LOS: 3 days   Delrae Rend 08/18/2013, 12:11 PM

## 2013-08-18 NOTE — Progress Notes (Signed)
Pt continued to have episodes of sinus bradycardia last night. Pt remained asymptomatic. Sinus brady episodes seemed to occur more during times of sleep. Vital signs remained normal. Verbalized information to oncoming nurse who will continue to monitor. Blanchard Kelch, RN

## 2013-08-19 LAB — T4, FREE: FREE T4: 0.46 ng/dL — AB (ref 0.80–1.80)

## 2013-08-19 LAB — BASIC METABOLIC PANEL
BUN: 7 mg/dL (ref 6–23)
CALCIUM: 9.3 mg/dL (ref 8.4–10.5)
CO2: 32 meq/L (ref 19–32)
Chloride: 89 mEq/L — ABNORMAL LOW (ref 96–112)
Creatinine, Ser: 0.82 mg/dL (ref 0.50–1.10)
GFR calc Af Amer: >90 mL/min (ref >90)
GFR, EST NON AFRICAN AMERICAN: 84 mL/min — AB (ref >90)
Glucose, Bld: 104 mg/dL — ABNORMAL HIGH (ref 70–99)
Potassium: 3.5 mEq/L — ABNORMAL LOW (ref 3.7–5.3)
SODIUM: 130 meq/L — AB (ref 137–147)

## 2013-08-19 NOTE — Clinical Documentation Improvement (Signed)
Possible Clinical Conditions?    hyponatremia       Other Condition   Supporting Information: Diagnostics: Na+: 130 (5/22) Treatment: Monitor, evaluate   Thank You, Joya Salm ,RN Clinical Documentation Specialist:  [facility]

## 2013-08-19 NOTE — Progress Notes (Signed)
PROGRESS NOTE    Sharon Hensley [facility]:[id number] DOB: 1965-10-09 DOA: 08/15/2013 PCP: Tamsen Roers, MD  HPI/Brief narrative 48 year old female with history of recent frequent ED visits for upper respiratory tract symptoms, attributed to sinusitis-treated with antibiotics, then for possible tonsillitis- treated with antibiotics and steroids, developed a reaction to steroids (hallucinations and nightmares)-which were attributed to prednisone but antibiotics were changed from clindamycin to azithromycin at patient's request, presented to ED again on 5/18 with complaints of swelling sensation in the throat, difficulty breathing and questionable syncopal episode while sitting. She was recently diagnosed at an urgent care with goiter and has been having 4-6 weeks history of generalized weakness, fatigue, exhaustion, throat swelling sensation, weight gain, cold intolerance, constipation and symptoms have been progressively getting worse. She was referred to see Dr. Delrae Rend but was unable to get an early appointment. She had an appointment to see ENT today. TSH drawn a few days ago > 100  Assessment/Plan:  1. 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 Patient was admitted to telemetry. Started on IV Synthroid. Neck ultrasound shows diffuse thyromegaly and marked heterogeneity of thyroid parenchyma with no dominant nodule or mass-? Previous thyroiditis and no surrounding lymphadenopathy. Patient's upper airway symptoms may be secondary to goiter, soft tissue swelling from hypothyroid/Myxedema & possible OSA. No features of acute airway compromise. Dr. Buddy Duty, endocrinology input appreciated-suspect Hashimoto's thyroiditis. Patient received IV levothyroxin 200 mcg on 5/19. Continue IV levothyroxin 100 mcg daily on 5/20 and 5/21. Transition to PO levothyroxine 150 daily from 5/22 based on response off IV thyroxine.  Follow daily free T4 to monitor her response to replacement treatment. 2. Severe Sinus bradycardia and sinus  pauses (both intermittent): Most likely secondary to problem #1 and need to rule out OSA. Continue monitoring on telemetry. Avoid negative chronotropic medications. 2-D echo shows normal EF. Cardiology input appreciated-no pacemaker indicated at this time, when necessary atropine should she become symptomatic, reassess after replacement treatment. Cardiology has signed off. 3. ? Syncope: 2-D echo with normal EF. Telemetry shows intermittent sinus bradycardia in the 20-30s and intermittent prolonged pauses up to 4.5 seconds. Orthostatic blood pressures negative. Carotid Dopplers: 1-39% ICA stenosis bilaterally. Echo with normal EF.? Due to problem #1 or severe sinus bradycardia related to problem #1. 4. Minimal transaminitis: No GI symptoms. May be secondary to hypothyroid. Liver ultrasound shows possible cyst and left lobe? Hemangioma-consider MRI. Acute hepatitis panel negative. 5. Iron deficiency/Microcytic anemia: Stable. Will need outpatient workup. Continue iron supplements. 6. Morbid obesity/rule out OSA: As per patient, history of snoring and apneic spells where her partner gets concerned and wakes her up. Will need formal outpatient sleep study. No stridor or features of airway compromise. Monitor closely. Upper airway features likely to improve with treatment of myxedema. No indication for urgent ENT consultation.    Code Status: Full Family Communication: No family at bedside Disposition Plan: Remains inpatient. Home when medically stable.   Consultants:  Endocrinology: Dr. Delrae Rend  Cardiology  Procedures:  None  Antibiotics:  None   Subjective: Patient seen and examined, swallowing is better with full liquid diet.  Objective: Filed Vitals:   08/18/13 0505 08/18/13 1327 08/19/13 0458 08/19/13 1334  BP: 147/76 137/84 135/86 124/73  Pulse: 84 80 82 70  Temp: 97.8 F (36.6 C) 98.3 F (36.8 C) 98.2 F (36.8 C) 98 F (36.7 C)  TempSrc: Oral Oral Oral Oral  Resp: 20  20 18 18   Height:      Weight: 101.5 kg (223 lb 12.3 oz)  100.381 kg (221  lb 4.8 oz)   SpO2: 97% 98% 99% 99%    Intake/Output Summary (Last 24 hours) at 08/19/13 1458 Last data filed at 08/19/13 1447  Gross per 24 hour  Intake    840 ml  Output      0 ml  Net    840 ml   Filed Weights   08/17/13 0547 08/18/13 0505 08/19/13 0458  Weight: 101.152 kg (223 lb) 101.5 kg (223 lb 12.3 oz) 100.381 kg (221 lb 4.8 oz)     Exam:  Physical Exam: Head: Normocephalic, atraumatic.  Eyes: No signs of jaundice, EOMI Nose: Mucous membranes dry.  Throat: Oropharynx edematous, thick tongue Neck: supple,No deformities, thyroid enlargement Lungs: Normal respiratory effort. B/L Clear to auscultation, no crackles or wheezes.  Heart: Regular RR. S1 and S2 normal  Abdomen: BS normoactive. Soft, Nondistended, non-tender.  Extremities: No pretibial edema, no erythema    Data Reviewed: Basic Metabolic Panel:  Recent Labs Lab 08/15/13 2348 08/16/13 0435 08/17/13 0520 08/18/13 0410 08/19/13 0353  NA 133* 137 138 133* 130*  K 3.7 3.7 3.5* 3.9 3.5*  CL 90* 94* 95* 90* 89*  CO2 32 31 33* 31 32  GLUCOSE 107* 103* 123* 104* 104*  BUN 9 8 6 7 7   CREATININE 1.07 0.92 0.86 0.85 0.82  CALCIUM 10.3 9.9 9.7 9.7 9.3   Liver Function Tests:  Recent Labs Lab 08/12/13 2100 08/15/13 2348 08/16/13 0435 08/18/13 0410  AST 94* 76* 70* 44*  ALT 61* 63* 59* 49*  ALKPHOS 71 72 68 71  BILITOT 0.2* <0.2* <0.2* 0.2*  PROT 8.4* 8.3 7.6 7.9  ALBUMIN 4.4 4.4 4.0 4.1   No results found for this basename: LIPASE, AMYLASE,  in the last 168 hours No results found for this basename: AMMONIA,  in the last 168 hours CBC:  Recent Labs Lab 08/12/13 2100 08/12/13 2101 08/15/13 2348 08/16/13 0435 08/18/13 0410  WBC 10.8*  --  11.6* 10.6* 10.4  NEUTROABS 7.9*  --  8.5*  --   --   HGB 8.8* 11.9* 9.4* 9.2* 9.4*  HCT 32.1* 35.0* 33.5* 32.2* 33.9*  MCV 71.5*  --  74.1* 73.3* 76.7*  PLT 308  --  359 317 369     Cardiac Enzymes:  Recent Labs Lab 08/16/13 0435 08/16/13 1158 08/16/13 1745  TROPONINI <0.30 <0.30 <0.30   BNP (last 3 results) No results found for this basename: PROBNP,  in the last 8760 hours CBG: No results found for this basename: GLUCAP,  in the last 168 hours  Recent Results (from the past 240 hour(s))  RAPID STREP SCREEN     Status: None   Collection Time    08/12/13 11:06 PM      Result Value Ref Range Status   Streptococcus, Group A Screen (Direct) NEGATIVE  NEGATIVE Final   Comment: (NOTE)     A Rapid Antigen test may result negative if the antigen level in the     sample is below the detection level of this test. The FDA has not     cleared this test as a stand-alone test therefore the rapid antigen     negative result has reflexed to a Group A Strep culture.  CULTURE, GROUP A STREP     Status: None   Collection Time    08/12/13 11:06 PM      Result Value Ref Range Status   Specimen Description THROAT   Final   Special Requests NONE   Final  Culture     Final   Value: No Beta Hemolytic Streptococci Isolated     Performed at [facility]   Report Status 08/14/2013 FINAL   Final      Additional labs: 1. Anemia panel: Iron 25, TIBC 343, saturation ratio 7, ferritin 32, folate 16 and B12: 553, reticulocytes 107.5 2. Urine pregnancy test: Negative 3. TSH >100, free T4 0.22 on 5/19. Free T4 0.25 and free T3 less than 0.3 on 5/15 4. Acute hepatitis panel: Negative 5. 2-D echo 08/16/13: Study Conclusions  - Left ventricle: The cavity size was normal. Wall thickness was normal. Systolic function was normal. The estimated ejection fraction was in the range of 55% to 60%. Wall motion was normal; there were no regional wall motion abnormalities.       Studies: No results found.      Scheduled Meds: . azithromycin  250 mg Oral Daily  . ferrous sulfate  325 mg Oral TID WC  . fluticasone  1 spray Each Nare Daily  . heparin  5,000 Units  Subcutaneous 3 times per day  . ipratropium  2 spray Each Nare QID  . levothyroxine  100 mcg Intravenous Daily  . multivitamin with minerals  1 tablet Oral Daily  . omega-3 acid ethyl esters  1 g Oral Daily  . oxymetazoline  1 spray Each Nare BID  . pantoprazole  40 mg Oral Daily  . sodium chloride  3 mL Intravenous Q12H   Continuous Infusions:    Principal Problem:   Myxedema Active Problems:   Goiter   Anemia   Hypothyroidism   Sinus bradycardia   Syncope    Time spent: 25 minutes    Oswald Hillock, MD. [facility] Pager [phone]  If 7PM-7AM, please contact night-coverage [email or web address] Password [email or web address] 08/19/2013, 2:58 PM    LOS: 4 days

## 2013-08-19 NOTE — Progress Notes (Signed)
Pt experienced another pause lasting 8.15 seconds. Raliegh Ip Schorr notified. Discussed situation, Schorr will pass along information for further assessment this am. Pt remains asymptomatic. VSS. Will continue to monitor closely. Blanchard Kelch, RN

## 2013-08-20 LAB — T4, FREE: FREE T4: 0.5 ng/dL — AB (ref 0.80–1.80)

## 2013-08-20 MED ORDER — LEVOTHYROXINE SODIUM 150 MCG PO TABS
150.0000 ug | ORAL_TABLET | Freq: Every day | ORAL | Status: DC
  Administered 2013-08-20: 150 ug via ORAL
  Filled 2013-08-20 (×2): qty 1

## 2013-08-20 MED ORDER — LEVOTHYROXINE SODIUM 150 MCG PO TABS: 150.0000 ug | ORAL_TABLET | Freq: Every day | ORAL | Status: DC

## 2013-08-20 NOTE — Discharge Summary (Addendum)
Physician Discharge Summary  Floreen Eskander [id number] DOB: [birth year] DOA: 08/15/2013  PCP: Tamsen Roers, MD  Admit date: 08/15/2013 Discharge date: 08/20/2013  Time spent: 50* minutes  Recommendations for Outpatient Follow-up:  1. Follow up Dr Delrae Rend on August 31, 2013 2. Will need MRI Liver as outpatient to r/o Liver mass, US shows possible hemangioma 3. ENT consult as outpatient 4. Anemia work up as outpatient, BMP in 2 weeks to follow on Hyponatremia   Discharge Diagnoses:  Principal Problem:   Myxedema Active Problems:   Goiter   Anemia   Hypothyroidism   Sinus bradycardia   Syncope   Discharge Condition: Stable  Diet recommendation: Regular diet  Filed Weights   08/18/13 0505 08/19/13 0458 08/20/13 0550  Weight: 101.5 kg (223 lb 12.3 oz) 100.381 kg (221 lb 4.8 oz) 98.521 kg (217 lb 3.2 oz)    History of present illness:  48 y.o. female, with history of anemia, recently diagnosed in urgent care with goiter who has been coming to the ER frequently off late presents today with 4-6 weeks history of generalized weakness and fatigue, feels exhausted, throat swelling, easy weight gain, heat intolerance, constipation, symptoms gradually getting worse, she went to urgent care where she was diagnosed with a goiter she was requested to see her endocrinologist but she could not get an appointment to September of this year, symptoms continued to get worse and she seek attention in the ER a few times in the last week, presents again today, I was called to admit the patient for above problems. On review a few days ago patient had a TSH checked which was greater than 100, her strep throat was negative, her likely symptoms appear to be myxedema with goiter   Hospital Course:  Hypothyroid/Myxedema: TSH >100. Patient was admitted to telemetry. Started on IV Synthroid. Neck ultrasound shows diffuse thyromegaly and marked heterogeneity of thyroid parenchyma with no dominant nodule or mass-?  Previous thyroiditis and no surrounding lymphadenopathy. Patient's upper airway symptoms may be secondary to goiter, soft tissue swelling from hypothyroid/Myxedema & possible OSA. No features of acute airway compromise. Dr. Buddy Duty, endocrinology  Was consulted,-suspect Hashimoto's thyroiditis. Patient received IV levothyroxin 200 mcg on 5/19, and then was started on Levothyroxine 100 mcg  IV daily. Transitioned to PO levothyroxine 150 daily from 5/23  Followed daily free T4 to monitor her response to replacement treatment. Last T4 on 5/21 was 0.46  Severe Sinus bradycardia and sinus pauses (both intermittent): Most likely secondary to problem #1 and need to rule out OSA. Continued monitoring on telemetry.  2-D echo shows normal EF. Cardiology input appreciated-no pacemaker indicated at this time, when necessary atropine should she become symptomatic,  Cardiology signed off. She is still having intermittent sinus pauses, but she has been asymptomatic in the hospital.   ? Syncope: Resolved,  2-D echo with normal EF. Telemetry shows intermittent sinus bradycardia in the 20-30s and intermittent prolonged pauses up to 4.5 seconds. Orthostatic blood pressures negative. Carotid Dopplers: 1-39% ICA stenosis bilaterally. Echo with normal EF.? Due to problem #1 or severe sinus bradycardia related to problem #1.   Minimal transaminitis: No GI symptoms. May be secondary to hypothyroid. Liver ultrasound shows possible cyst and left lobe? Hemangioma- MRI as outpatient once more stable in 2 weeks, discussed with the patient and she agrees with the plan. Acute hepatitis panel negative.  Iron deficiency/Microcytic anemia: Stable. Will need outpatient workup. Continue iron supplements.   Morbid obesity/rule out OSA: As per patient, history of snoring and  apneic spells where her partner gets concerned and wakes her up. Will need formal outpatient sleep study. No stridor or features of airway compromise. Monitor closely. Upper  airway features likely to improve with treatment of myxedema. ENT consultation as outpatient, she already has an appointment next month.  Mild Hyponatremia Today sodium is 130, likely due to hypothyroidism, will need follow up BMP as outpatient.  Procedures:  Carotid doppler The vertebral arteries appear patent with antegrade flow. - Findings consistent with 1- 39 percent stenosis involving the right internal carotid artery and the left internal carotid artery.   2d echo  The cavity size was normal. Wall thickness was normal. Systolic function was normal. The estimated ejection fraction was in the range of 55% to 60%. Wall motion was normal; there were no regional wall motion abnormalities. Doppler parameters are consistent with abnormal left ventricular relaxation (grade 1 diastolic dysfunction).     Consultations:  Endocrinology  Cardiology   Discharge Exam: Filed Vitals:   08/20/13 0550  BP: 118/77  Pulse: 70  Temp: 98.2 F (36.8 C)  Resp: 18    General: Appear in no acute distress Cardiovascular: S1s2 RRR Respiratory: Clear bilaterally  Discharge Instructions You were cared for by a hospitalist during your hospital stay. If you have any questions about your discharge medications or the care you received while you were in the hospital after you are discharged, you can call the unit and asked to speak with the hospitalist on call if the hospitalist that took care of you is not available. Once you are discharged, your primary care physician will handle any further medical issues. Please note that NO REFILLS for any discharge medications will be authorized once you are discharged, as it is imperative that you return to your primary care physician (or establish a relationship with a primary care physician if you do not have one) for your aftercare needs so that they can reassess your need for medications and monitor your lab values.  Discharge Instructions   Diet - low  sodium heart healthy    Complete by:  As directed      Increase activity slowly    Complete by:  As directed             Medication List    STOP taking these medications       azithromycin 250 MG tablet  Commonly known as:  ZITHROMAX     doxycycline 100 MG tablet  Commonly known as:  VIBRA-TABS      TAKE these medications       COCONUT OIL PO  Take 1 capsule by mouth daily.     famotidine-calcium carbonate-magnesium hydroxide 10-800-165 MG Chew chewable tablet  Commonly known as:  PEPCID COMPLETE  Chew 1 tablet by mouth at bedtime as needed (heartburn).     ferrous sulfate 325 (65 FE) MG tablet  Take 325 mg by mouth 3 (three) times daily with meals.     fluticasone 50 MCG/ACT nasal spray  Commonly known as:  FLONASE  Place 1 spray into both nostrils daily.     ipratropium 0.06 % nasal spray  Commonly known as:  ATROVENT  Place 2 sprays into both nostrils 4 (four) times daily.     levothyroxine 150 MCG tablet  Commonly known as:  SYNTHROID, LEVOTHROID  Take 1 tablet (150 mcg total) by mouth daily before breakfast.     multivitamin with minerals Tabs tablet  Take 1 tablet by mouth daily.  omega-3 acid ethyl esters 1 G capsule  Commonly known as:  LOVAZA  Take 1 g by mouth daily.     omeprazole 20 MG capsule  Commonly known as:  PRILOSEC  Take 40 mg by mouth daily with breakfast.       Allergies  Allergen Reactions  . Clindamycin/Lincomycin Anaphylaxis  . Penicillins Hives and Itching  . Veetids [Penicillin V] Hives and Itching       Follow-up Information   Follow up with KERR,JEFFREY, MD On 08/31/2013. (8 ;15 am)    Specialty:  Endocrinology   Contact information:   [address] [place] [address] [phone]        The results of significant diagnostics from this hospitalization (including imaging, microbiology, ancillary and laboratory) are listed below for reference.    Significant Diagnostic Studies: Dg Neck Soft  Tissue  08/16/2013   CLINICAL DATA:  ORAL SWELLING  Is it is okay 04/05/2001  EXAM: NECK SOFT TISSUES - 1+ VIEW  COMPARISON:  CT NECK W/CM dated 08/12/2013  FINDINGS: When correlated with prior CT there has been no significant change. There is prominence in the palatine tonsils and adenoid regions. No acute osseous abnormalities.  IMPRESSION: Prominence in the palatine tonsils and adenoid regions.   Electronically Signed   By: Margaree Mackintosh M.D.   On: 08/16/2013 00:20   Ct Soft Tissue Neck W Contrast  08/12/2013   CLINICAL DATA:  Difficulty breathing, difficulty swallowing. History of goiter.  EXAM: CT NECK WITH CONTRAST  TECHNIQUE: Multidetector CT imaging of the neck was performed using the standard protocol following the bolus administration of intravenous contrast.  CONTRAST:  75mL OMNIPAQUE IOHEXOL 300 MG/ML  SOLN  COMPARISON:  None.  FINDINGS: Mild motion degraded examination, symmetric enlargement of the palatine tonsils, and retropharyngeal soft tissues. Preservation parapharyngeal fat tissue planes. Mild effacement of the left piriform sinus via low-density possible fluid/ secretions. Larynx is unremarkable.  Normal appearance of the major salivary glands. Thyromegaly, thyroid is at least 6.8 cm in craniocaudad dimension. No dominant nodule. There free fluid or fluid collections within the neck. No lymphadenopathy by CT size criteria. Retropharyngeal course of left carotid artery may be transient, normal variant  Straightened cervical lordosis with moderate C5-6, mild to moderate C6-7 degenerative disc disease. Bilateral middle ear and mastoid effusions. Visualized paranasal sinuses are well aerated. Lung apices are clear.  IMPRESSION: Prominent palatine tonsils and adenoidal soft tissues, is unclear this reflects acute process, recommend correlation with immune status. Bilateral middle ear and mastoid effusions may be postobstructive. In addition, fluid/secretions effacing the left piriform sinus. For  constellation of findings, recommend direct inspection. Patent airway.  Thyromegaly, without mass effect on the trachea.   Electronically Signed   By: Elon Alas   On: 08/12/2013 22:15   US Soft Tissue Head/neck  08/16/2013   CLINICAL DATA:  48 year old female with goiter. Neck mass. Initial encounter.  EXAM: THYROID ULTRASOUND  TECHNIQUE: Ultrasound examination of the thyroid gland and adjacent soft tissues was performed.  COMPARISON:  Neck CT 08/12/2013.  FINDINGS: Right thyroid lobe  Measurements: 6.2 x 3.0 x 3.9 cm. Diffusely heterogeneous echotexture. No discrete nodule.  Left thyroid lobe  Measurements: 6.5 x 3.1 x 3.2 cm. Diffusely heterogeneous echotexture, lobe replaced by diffusely nodular parenchyma (image 24). No dominant nodule.  Isthmus  Thickness: 2 mm.  No nodules visualized.  Lymphadenopathy  Small level 4 nodes with normal fatty hila, up to 7 mm short axis.  IMPRESSION: Diffuse  thyromegaly and marked heterogeneity of thyroid parenchyma, with no dominant nodule or mass. Query previous thyroiditis. No surrounding lymphadenopathy.   Electronically Signed   By: Lars Pinks M.D.   On: 08/16/2013 10:08   US Abdomen Limited Ruq  08/16/2013   CLINICAL DATA:  Elevated liver enzymes and thrombocytopenia; history previous cholecystectomy  EXAM: US ABDOMEN LIMITED - RIGHT UPPER QUADRANT  COMPARISON:  Noncontrast CT scan of the abdomen and pelvis dated April 01, 2003.  FINDINGS: Gallbladder:  The gallbladder surgically at  Common bile duct:  Diameter: By 0.3 mm.  Liver:  There is a well-defined anechoic focus in the left hepatic lobe measuring 2.2 x 1.9 x 2 point 1 cm. In the interlobar region there is a well-defined 2.7 x 1.8 x 2.7 cm anechoic structure consistent with a cyst. There is no intrahepatic ductal dilation. A focus of subtle increased echotexture in the left hepatic lobe measures 2.4 x 0.9 x 1.8 cm. There is no intrahepatic ductal dilation.  IMPRESSION: 1. There are well defined ovoid  anechoic structures in the liver as described consistent with cysts. A subtle area of increased echotexture in the left hepatic lobe is demonstrated and is nonspecific but may reflect a hemangioma. Further evaluation of the liver with a hepatic protocol MRI would be useful. 2. The gallbladder is surgically absent. The common bile duct is normal in diameter.   Electronically Signed   By: David  Martinique   On: 08/16/2013 09:45    Microbiology: Recent Results (from the past 240 hour(s))  RAPID STREP SCREEN     Status: None   Collection Time    08/12/13 11:06 PM      Result Value Ref Range Status   Streptococcus, Group A Screen (Direct) NEGATIVE  NEGATIVE Final   Comment: (NOTE)     A Rapid Antigen test may result negative if the antigen level in the     sample is below the detection level of this test. The FDA has not     cleared this test as a stand-alone test therefore the rapid antigen     negative result has reflexed to a Group A Strep culture.  CULTURE, GROUP A STREP     Status: None   Collection Time    08/12/13 11:06 PM      Result Value Ref Range Status   Specimen Description THROAT   Final   Special Requests NONE   Final   Culture     Final   Value: No Beta Hemolytic Streptococci Isolated     Performed at [facility]   Report Status 08/14/2013 FINAL   Final     Labs: Basic Metabolic Panel:  Recent Labs Lab 08/15/13 2348 08/16/13 0435 08/17/13 0520 08/18/13 0410 08/19/13 0353  NA 133* 137 138 133* 130*  K 3.7 3.7 3.5* 3.9 3.5*  CL 90* 94* 95* 90* 89*  CO2 32 31 33* 31 32  GLUCOSE 107* 103* 123* 104* 104*  BUN 9 8 6 7 7   CREATININE 1.07 0.92 0.86 0.85 0.82  CALCIUM 10.3 9.9 9.7 9.7 9.3   Liver Function Tests:  Recent Labs Lab 08/15/13 2348 08/16/13 0435 08/18/13 0410  AST 76* 70* 44*  ALT 63* 59* 49*  ALKPHOS 72 68 71  BILITOT <0.2* <0.2* 0.2*  PROT 8.3 7.6 7.9  ALBUMIN 4.4 4.0 4.1   No results found for this basename: LIPASE, AMYLASE,  in the  last 168 hours No results found for this basename: AMMONIA,  in the last 168 hours CBC:  Recent Labs Lab 08/15/13 2348 08/16/13 0435 08/18/13 0410  WBC 11.6* 10.6* 10.4  NEUTROABS 8.5*  --   --   HGB 9.4* 9.2* 9.4*  HCT 33.5* 32.2* 33.9*  MCV 74.1* 73.3* 76.7*  PLT 359 317 369   Cardiac Enzymes:  Recent Labs Lab 08/16/13 0435 08/16/13 1158 08/16/13 1745  TROPONINI <0.30 <0.30 <0.30   BNP: BNP (last 3 results) No results found for this basename: PROBNP,  in the last 8760 hours CBG: No results found for this basename: GLUCAP,  in the last 168 hours     Signed:  Oswald Hillock  [facility] 08/20/2013, 8:52 AM

## 2013-09-13 ENCOUNTER — Ambulatory Visit: Payer: Self-pay | Admitting: Internal Medicine

## 2014-05-13 ENCOUNTER — Emergency Department (INDEPENDENT_AMBULATORY_CARE_PROVIDER_SITE_OTHER)
Admission: EM | Admit: 2014-05-13 | Discharge: 2014-05-13 | Disposition: A | Discharge status: Home / Self Care | Payer: Self-pay | Source: Home / Self Care | Attending: Family Medicine | Admitting: Family Medicine

## 2014-05-13 ENCOUNTER — Encounter (HOSPITAL_COMMUNITY): Payer: Self-pay

## 2014-05-13 DIAGNOSIS — H6503 Acute serous otitis media, bilateral: Secondary | ICD-10-CM

## 2014-05-13 MED ORDER — CEFDINIR 300 MG PO CAPS: 300.0000 mg | ORAL_CAPSULE | Freq: Two times a day (BID) | ORAL | Status: DC

## 2014-05-13 NOTE — ED Notes (Signed)
Recent URI, long history of hearing issues. States she has been having pain and drainage from both ears, dizziness, room spinning

## 2014-05-13 NOTE — ED Provider Notes (Signed)
CSN: [id number]     Arrival date & time 05/13/14  1512 History   First MD Initiated Contact with Patient 05/13/14 1540     Chief Complaint  Patient presents with  . Otalgia   (Consider location/radiation/quality/duration/timing/severity/associated sxs/prior Treatment) Patient is a 49 y.o. female presenting with ear pain. The history is provided by the patient and a parent.  Otalgia Location:  Bilateral Behind ear:  No abnormality Quality:  Dull and throbbing Severity:  Moderate Onset quality:  Gradual Duration:  2 weeks Progression:  Worsening Chronicity:  Chronic Relieved by:  None tried Worsened by:  Nothing tried Associated symptoms: ear discharge   Associated symptoms: no fever     Past Medical History  Diagnosis Date  . Goiter   . Anemia   . Temporary low platelet count    Past Surgical History  Procedure Laterality Date  . Cholecystectomy     Family History  Problem Relation Age of Onset  . Diabetes Mother   . Diabetes Other    History  Substance Use Topics  . Smoking status: Former Research scientist (life sciences)  . Smokeless tobacco: Not on file  . Alcohol Use: No   OB History    No data available     Review of Systems  Constitutional: Negative.  Negative for fever.  HENT: Positive for ear discharge and ear pain.     Allergies  Clindamycin/lincomycin; Penicillins; and Veetids  Home Medications   Prior to Admission medications   Medication Sig Start Date End Date Taking? Authorizing Provider  cefdinir (OMNICEF) 300 MG capsule Take 1 capsule (300 mg total) by mouth 2 (two) times daily. 05/13/14   Billy Fischer, MD  COCONUT OIL PO Take 1 capsule by mouth daily.    Historical Provider, MD  famotidine-calcium carbonate-magnesium hydroxide (PEPCID COMPLETE) 10-800-165 MG CHEW chewable tablet Chew 1 tablet by mouth at bedtime as needed (heartburn).    Historical Provider, MD  ferrous sulfate 325 (65 FE) MG tablet Take 325 mg by mouth 3 (three) times daily with meals.      Historical Provider, MD  fluticasone (FLONASE) 50 MCG/ACT nasal spray Place 1 spray into both nostrils daily.     Historical Provider, MD  ipratropium (ATROVENT) 0.06 % nasal spray Place 2 sprays into both nostrils 4 (four) times daily. 07/20/13   Harden Mo, MD  levothyroxine (SYNTHROID, LEVOTHROID) 150 MCG tablet Take 1 tablet (150 mcg total) by mouth daily before breakfast. 08/20/13   Oswald Hillock, MD  Multiple Vitamin (MULTIVITAMIN WITH MINERALS) TABS tablet Take 1 tablet by mouth daily.    Historical Provider, MD  omega-3 acid ethyl esters (LOVAZA) 1 G capsule Take 1 g by mouth daily.     Historical Provider, MD  omeprazole (PRILOSEC) 20 MG capsule Take 40 mg by mouth daily with breakfast.    Historical Provider, MD   BP 152/78 mmHg  Pulse 79  Temp(Src) 98 F (36.7 C) (Oral)  Resp 16  SpO2 98% Physical Exam  Constitutional: She is oriented to person, place, and time. She appears well-developed and well-nourished.  HENT:  Right Ear: Ear canal normal. Tympanic membrane is injected, perforated, erythematous and bulging. A middle ear effusion is present.  Left Ear: Ear canal normal. Tympanic membrane is injected, perforated, erythematous and bulging. A middle ear effusion is present.  Mouth/Throat: Oropharynx is clear and moist.  Neck: Normal range of motion. Neck supple.  Lymphadenopathy:    She has no cervical adenopathy.  Neurological: She is alert and  oriented to person, place, and time.  Skin: Skin is warm and dry.  Nursing note and vitals reviewed.   ED Course  Procedures (including critical care time) Labs Review Labs Reviewed - No data to display  Imaging Review No results found.   MDM   1. Bilateral acute serous otitis media, recurrence not specified       Billy Fischer, MD 05/13/14 1624

## 2014-05-13 NOTE — Discharge Instructions (Signed)
Take all of medicine , use tylenol or advil for pain and fever as needed, see your doctor in 10 - 14 days for ear recheck  °

## 2016-10-12 ENCOUNTER — Encounter (HOSPITAL_COMMUNITY): Payer: Self-pay

## 2016-10-12 ENCOUNTER — Emergency Department (HOSPITAL_COMMUNITY)
Admission: EM | Admit: 2016-10-12 | Discharge: 2016-10-12 | Disposition: A | Discharge status: Home / Self Care | Payer: BLUE CROSS/BLUE SHIELD | Attending: Emergency Medicine | Admitting: Emergency Medicine

## 2016-10-12 DIAGNOSIS — M25511 Pain in right shoulder: Secondary | ICD-10-CM | POA: Insufficient documentation

## 2016-10-12 DIAGNOSIS — Z79899 Other long term (current) drug therapy: Secondary | ICD-10-CM | POA: Diagnosis not present

## 2016-10-12 DIAGNOSIS — T07XXXA Unspecified multiple injuries, initial encounter: Secondary | ICD-10-CM

## 2016-10-12 DIAGNOSIS — M79644 Pain in right finger(s): Secondary | ICD-10-CM | POA: Insufficient documentation

## 2016-10-12 DIAGNOSIS — Y999 Unspecified external cause status: Secondary | ICD-10-CM | POA: Diagnosis not present

## 2016-10-12 DIAGNOSIS — M542 Cervicalgia: Secondary | ICD-10-CM | POA: Diagnosis not present

## 2016-10-12 DIAGNOSIS — S60512A Abrasion of left hand, initial encounter: Secondary | ICD-10-CM | POA: Diagnosis not present

## 2016-10-12 DIAGNOSIS — S30811A Abrasion of abdominal wall, initial encounter: Secondary | ICD-10-CM | POA: Insufficient documentation

## 2016-10-12 DIAGNOSIS — Z23 Encounter for immunization: Secondary | ICD-10-CM | POA: Insufficient documentation

## 2016-10-12 DIAGNOSIS — Y92410 Unspecified street and highway as the place of occurrence of the external cause: Secondary | ICD-10-CM | POA: Diagnosis not present

## 2016-10-12 DIAGNOSIS — E039 Hypothyroidism, unspecified: Secondary | ICD-10-CM | POA: Diagnosis not present

## 2016-10-12 DIAGNOSIS — Z87891 Personal history of nicotine dependence: Secondary | ICD-10-CM | POA: Diagnosis not present

## 2016-10-12 DIAGNOSIS — Y9389 Activity, other specified: Secondary | ICD-10-CM | POA: Diagnosis not present

## 2016-10-12 DIAGNOSIS — S50811A Abrasion of right forearm, initial encounter: Secondary | ICD-10-CM | POA: Insufficient documentation

## 2016-10-12 MED ORDER — IBUPROFEN 200 MG PO TABS
400.0000 mg | ORAL_TABLET | Freq: Once | ORAL | Status: AC
  Administered 2016-10-12: 400 mg via ORAL
  Filled 2016-10-12: qty 2

## 2016-10-12 MED ORDER — TETANUS-DIPHTH-ACELL PERTUSSIS 5-2.5-18.5 LF-MCG/0.5 IM SUSP
0.5000 mL | Freq: Once | INTRAMUSCULAR | Status: AC
Start: 2016-10-12 — End: 2016-10-12
  Administered 2016-10-12: 0.5 mL via INTRAMUSCULAR
  Filled 2016-10-12: qty 0.5
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
## 2016-10-12 NOTE — Discharge Instructions (Signed)
There were no serious injuries found on exam today.  He will likely have some more pain tomorrow.  Take ibuprofen 400 mg 3 times a day with meals for pain.  Use ice on the sore areas today and tomorrow, after that use heat.  Keep the wounds on your arms and abdomen clean with soap and water, once or twice a day.  Return here, if needed, for problems.

## 2017-03-02 ENCOUNTER — Ambulatory Visit (HOSPITAL_COMMUNITY)
Admission: EM | Admit: 2017-03-02 | Discharge: 2017-03-02 | Disposition: A | Discharge status: Home / Self Care | Payer: BLUE CROSS/BLUE SHIELD | Attending: Emergency Medicine | Admitting: Emergency Medicine

## 2017-03-02 ENCOUNTER — Encounter (HOSPITAL_COMMUNITY): Payer: Self-pay | Admitting: Emergency Medicine

## 2017-03-02 ENCOUNTER — Other Ambulatory Visit: Payer: Self-pay

## 2017-03-02 DIAGNOSIS — H66003 Acute suppurative otitis media without spontaneous rupture of ear drum, bilateral: Secondary | ICD-10-CM | POA: Diagnosis not present

## 2017-03-02 MED ORDER — CEFDINIR 300 MG PO CAPS: 300.0000 mg | ORAL_CAPSULE | Freq: Two times a day (BID) | ORAL | 0 refills | Status: AC

## 2017-03-02 MED ORDER — FLUTICASONE PROPIONATE 50 MCG/ACT NA SUSP: 1.0000 | Freq: Every day | NASAL | 2 refills | Status: DC

## 2017-03-02 NOTE — ED Provider Notes (Signed)
[facility]    CSN: [id number] Arrival date & time: 03/02/17  1842     History   Chief Complaint Chief Complaint  Patient presents with  . Otalgia  . Dizziness    HPI Sharon Hensley is a 51 y.o. female.   Sharon Hensley presents with family with complaints of bilateral ear pain, left greater than right which has been worsening over the past week. It makes her feel dizzy with nausea and vomiting due to spinning sensation from ear pain. She states she has had similar in the past. Tried ear drops which have not helped. States she has had ear drainage and decreased hearing. Denies runny nose, sore throat. Rates pain 8/10, extends to head behind ears.    ROS per HPI.       Past Medical History:  Diagnosis Date  . Anemia   . Goiter   . Temporary low platelet count [facility])     Patient Active Problem List   Diagnosis Date Noted  . 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 Hypothyroidism 08/16/2013  . Sinus bradycardia 08/16/2013  . Syncope 08/16/2013  . Goiter   . Anemia     Past Surgical History:  Procedure Laterality Date  . CHOLECYSTECTOMY      OB History    No data available       Home Medications    Prior to Admission medications   Medication Sig Start Date End Date Taking? Authorizing Provider  famotidine-calcium carbonate-magnesium hydroxide (PEPCID COMPLETE) [phone] MG CHEW chewable tablet Chew 1 tablet by mouth at bedtime as needed (heartburn).   Yes [provider]  ferrous sulfate 325 (65 FE) MG tablet Take 325 mg by mouth 3 (three) times daily with meals.    Yes [provider]  levothyroxine (SYNTHROID, LEVOTHROID) 137 MCG tablet Take 137 mcg by mouth daily before breakfast.   Yes [provider]  loratadine (CLARITIN) 10 MG tablet Take 10 mg by mouth daily.   Yes [provider]  Multiple Vitamin (MULTIVITAMIN WITH MINERALS) TABS tablet Take 1 tablet by mouth daily.   Yes [provider]  omeprazole (PRILOSEC) 20 MG  capsule Take 20 mg by mouth 2 (two) times daily before a meal.    Yes [provider]  oxymetazoline (AFRIN) 0.05 % nasal spray Place 1 spray into both nostrils 2 (two) times daily.   Yes [provider]  cefdinir (OMNICEF) 300 MG capsule Take 1 capsule (300 mg total) by mouth 2 (two) times daily for 10 days. 03/02/17 03/12/17  Zigmund Gottron, NP  fluticasone (FLONASE) 50 MCG/ACT nasal spray Place 1 spray into both nostrils daily. 03/02/17   Zigmund Gottron, NP    Family History Family History  Problem Relation Age of Onset  . Diabetes Mother   . Diabetes Other     Social History Social History   Tobacco Use  . Smoking status: Former Research scientist (life sciences)  . Smokeless tobacco: Never Used  Substance Use Topics  . Alcohol use: No  . Drug use: No     Allergies   Clindamycin/lincomycin; Penicillins; and Veetids [penicillin v]   Review of Systems Review of Systems   Physical Exam Triage Vital Signs ED Triage Vitals [03/02/17 1937]  Enc Vitals Group     BP (!) 147/82     Pulse Rate 87     Resp 18     Temp 98.7 F (37.1 C)     Temp src      SpO2 99 %  Weight      Height      Head Circumference      Peak Flow      Pain Score 8     Pain Loc      Pain Edu?      Excl. in [place]?    No data found.  Updated Vital Signs BP (!) 147/82   Pulse 87   Temp 98.7 F (37.1 C)   Resp 18   SpO2 99%   Visual Acuity Right Eye Distance:   Left Eye Distance:   Bilateral Distance:    Right Eye Near:   Left Eye Near:    Bilateral Near:     Physical Exam  Constitutional: She is oriented to person, place, and time. She appears well-developed and well-nourished. No distress.  HENT:  Head: Normocephalic and atraumatic.  Right Ear: External ear and ear canal normal. Tympanic membrane is erythematous. A middle ear effusion is present.  Left Ear: External ear and ear canal normal. Tympanic membrane is erythematous. A middle ear effusion is present.  Nose: Mucosal edema  present.  Mouth/Throat: Uvula is midline, oropharynx is clear and moist and mucous membranes are normal. No tonsillar exudate.  Eyes: Conjunctivae and EOM are normal. Pupils are equal, round, and reactive to light.  Cardiovascular: Normal rate, regular rhythm and normal heart sounds.  Pulmonary/Chest: Effort normal and breath sounds normal.  Neurological: She is alert and oriented to person, place, and time.  Skin: Skin is warm and dry.     UC Treatments / Results  Labs (all labs ordered are listed, but only abnormal results are displayed) Labs Reviewed - No data to display  EKG  EKG Interpretation None       Radiology No results found.  Procedures Procedures (including critical care time)  Medications Ordered in UC Medications - No data to display   Initial Impression / Assessment and Plan / UC Course  I have reviewed the triage vital signs and the nursing notes.  Pertinent labs & imaging results that were available during my care of the patient were reviewed by me and considered in my medical decision making (see chart for details).     Significant effusion bilaterally with redness noted. Will treat for infection, daily flonase as well. Already take daily claritin. Push fluids to ensure adequate hydration and keep secretions thin. If symptoms worsen or do not improve in the next week to return to be seen or to follow up with PCP. Patient verbalized understanding and agreeable to plan.    Final Clinical Impressions(s) / UC Diagnoses   Final diagnoses:  Acute suppurative otitis media of both ears without spontaneous rupture of tympanic membranes, recurrence not specified    ED Discharge Orders        Ordered    cefdinir (OMNICEF) 300 MG capsule  2 times daily     03/02/17 2050    fluticasone (FLONASE) 50 MCG/ACT nasal spray  Daily     03/02/17 2050       Controlled Substance Prescriptions [place] Controlled Substance Registry consulted? Not Applicable   Zigmund Gottron, NP 03/02/17 2106

## 2017-03-02 NOTE — Discharge Instructions (Signed)
Push fluids to ensure adequate hydration and keep secretions thin.  Daily nasal spray and complete course of antibiotics. If symptoms worsen or do not improve in the next week to return to be seen or to follow up with PCP.

## 2017-03-02 NOTE — ED Triage Notes (Signed)
Pt states she starting having L ear pain a week ago, pt states the pain in her L ear got worse, with drainage, and dizziness.

## 2017-03-07 ENCOUNTER — Other Ambulatory Visit: Payer: Self-pay

## 2017-03-07 ENCOUNTER — Encounter (HOSPITAL_COMMUNITY): Payer: Self-pay | Admitting: Emergency Medicine

## 2017-03-07 ENCOUNTER — Ambulatory Visit (HOSPITAL_COMMUNITY)
Admission: EM | Admit: 2017-03-07 | Discharge: 2017-03-07 | Disposition: A | Discharge status: Home / Self Care | Payer: BLUE CROSS/BLUE SHIELD | Attending: Physician Assistant | Admitting: Physician Assistant

## 2017-03-07 DIAGNOSIS — R110 Nausea: Secondary | ICD-10-CM

## 2017-03-07 DIAGNOSIS — H6593 Unspecified nonsuppurative otitis media, bilateral: Secondary | ICD-10-CM | POA: Diagnosis not present

## 2017-03-07 DIAGNOSIS — R030 Elevated blood-pressure reading, without diagnosis of hypertension: Secondary | ICD-10-CM

## 2017-03-07 MED ORDER — PREDNISONE 5 MG (21) PO TBPK: ORAL_TABLET | ORAL | 0 refills | Status: DC

## 2017-03-07 MED ORDER — ONDANSETRON HCL 4 MG PO TABS: 4.0000 mg | ORAL_TABLET | Freq: Three times a day (TID) | ORAL | 0 refills | Status: DC | PRN

## 2017-03-07 NOTE — ED Triage Notes (Signed)
Pt was seen here on Monday for ear pain.  She was given an antibiotic and nasal spray.  Since then she has been very dizzy and nauseous and she is having bilateral drainage from her ears.

## 2017-03-07 NOTE — ED Provider Notes (Signed)
[place]    CSN: [id number] Arrival date & time: 03/07/17  1213     History   Chief Complaint Chief Complaint  Patient presents with  . Ear Problem    bilateral    HPI Sharon Hensley is a 51 y.o. female.   Presents with ongoing ear "fullness", nausea and mild dizziness. She was evaluated in the ED on 12/03 and is currently using Flonase and Omnicef for Otitis media. She is feeling some better but feels that her nausea and dizziness may be some worse. She is suppose to return to work, but does not feel that she can just yet. She has no fever or chills. No URI symptoms. No hearing aids, but some chronic hear loss.       Past Medical History:  Diagnosis Date  . Anemia   . Goiter   . Temporary low platelet count [facility])     Patient Active Problem List   Diagnosis Date Noted  . 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 Hypothyroidism 08/16/2013  . Sinus bradycardia 08/16/2013  . Syncope 08/16/2013  . Goiter   . Anemia     Past Surgical History:  Procedure Laterality Date  . CHOLECYSTECTOMY      OB History    No data available       Home Medications    Prior to Admission medications   Medication Sig Start Date End Date Taking? Authorizing Provider  cefdinir (OMNICEF) 300 MG capsule Take 1 capsule (300 mg total) by mouth 2 (two) times daily for 10 days. 03/02/17 03/12/17 Yes Zigmund Gottron, NP  famotidine-calcium carbonate-magnesium hydroxide (PEPCID COMPLETE) 10-800-165 MG CHEW chewable tablet Chew 1 tablet by mouth at bedtime as needed (heartburn).   Yes [provider]  ferrous sulfate 325 (65 FE) MG tablet Take 325 mg by mouth 3 (three) times daily with meals.    Yes [provider]  fluticasone (FLONASE) 50 MCG/ACT nasal spray Place 1 spray into both nostrils daily. 03/02/17  Yes Burky, Lanelle Bal B, NP  levothyroxine (SYNTHROID, LEVOTHROID) 137 MCG tablet Take 137 mcg by mouth daily before breakfast.   Yes [provider]  loratadine  (CLARITIN) 10 MG tablet Take 10 mg by mouth daily.   Yes [provider]  Multiple Vitamin (MULTIVITAMIN WITH MINERALS) TABS tablet Take 1 tablet by mouth daily.   Yes [provider]  omeprazole (PRILOSEC) 20 MG capsule Take 20 mg by mouth 2 (two) times daily before a meal.    Yes [provider]  oxymetazoline (AFRIN) 0.05 % nasal spray Place 1 spray into both nostrils 2 (two) times daily.   Yes [provider]  ondansetron (ZOFRAN) 4 MG tablet Take 1 tablet (4 mg total) by mouth every 8 (eight) hours as needed for nausea or vomiting. 03/07/17   Bjorn Pippin, PA-C  predniSONE (STERAPRED UNI-PAK 21 TAB) 5 MG (21) TBPK tablet Take as directed 03/07/17   Bjorn Pippin, PA-C    Family History Family History  Problem Relation Age of Onset  . Diabetes Mother   . Diabetes Other     Social History Social History   Tobacco Use  . Smoking status: Former Research scientist (life sciences)  . Smokeless tobacco: Never Used  Substance Use Topics  . Alcohol use: No  . Drug use: No     Allergies   Clindamycin/lincomycin; Penicillins; and Veetids [penicillin v]   Review of Systems Review of Systems  Constitutional: Negative for chills and fever.  HENT: Negative for ear pain.  Eyes: Negative.   Respiratory: Negative for cough and shortness of breath.   Gastrointestinal: Positive for nausea.  Neurological: Positive for dizziness.     Physical Exam Triage Vital Signs ED Triage Vitals [03/07/17 1328]  Enc Vitals Group     BP (!) 155/91     Pulse Rate 88     Resp      Temp 98.4 F (36.9 C)     Temp Source Oral     SpO2 99 %     Weight      Height      Head Circumference      Peak Flow      Pain Score      Pain Loc      Pain Edu?      Excl. in [place]?    No data found.  Updated Vital Signs BP (!) 155/91 (BP Location: Left Arm)   Pulse 88   Temp 98.4 F (36.9 C) (Oral)   SpO2 99%   Visual Acuity Right Eye Distance:   Left Eye Distance:   Bilateral  Distance:    Right Eye Near:   Left Eye Near:    Bilateral Near:     Physical Exam  Constitutional: She appears well-developed and well-nourished. No distress.  HENT:  Mouth/Throat: No oropharyngeal exudate.  Bilateral ears with improved erythema as reported from last ON, mild effusions remain bilaterally, no drainage  Cardiovascular: Normal rate.  Pulmonary/Chest: Effort normal.  Lymphadenopathy:    She has no cervical adenopathy.  Skin: Skin is warm and dry. She is not diaphoretic.  Psychiatric: Her behavior is normal.  Nursing note and vitals reviewed.    UC Treatments / Results  Labs (all labs ordered are listed, but only abnormal results are displayed) Labs Reviewed - No data to display  EKG  EKG Interpretation None       Radiology No results found.  Procedures Procedures (including critical care time)  Medications Ordered in UC Medications - No data to display   Initial Impression / Assessment and Plan / UC Course  I have reviewed the triage vital signs and the nursing notes.  Pertinent labs & imaging results that were available during my care of the patient were reviewed by me and considered in my medical decision making (see chart for details).   Recommend completion of abx, add prednisone pack low dose with use of Mucinex. Continue with Flonase.  If she is not improving by Tuesday then suggest referral to ENT, information given. May use Zofran for Nausea. Work note given as well.  FU with PCP for elevated BP   Final Clinical Impressions(s) / UC Diagnoses   Final diagnoses:  Bilateral otitis media with effusion  Nausea without vomiting  Elevated BP without diagnosis of hypertension    ED Discharge Orders        Ordered    ondansetron (ZOFRAN) 4 MG tablet  Every 8 hours PRN,   Status:  Discontinued     03/07/17 1350    predniSONE (STERAPRED UNI-PAK 21 TAB) 5 MG (21) TBPK tablet     03/07/17 1350    ondansetron (ZOFRAN) 4 MG tablet  Every 8  hours PRN     03/07/17 1355       Controlled Substance Prescriptions [place] Controlled Substance Registry consulted? Not Applicable   Bjorn Pippin, PA-C 03/07/17 1401

## 2017-03-07 NOTE — Discharge Instructions (Signed)
You have improving infection but your fluid remains. Will treat with a prednisone pack for 6 days, Mucinex Max Strength (OTC) use as directed every 12 hours with 4-6 oz of water. Continue with flonase and complete your antibiotic. May use Zofran as needed for nausea. FU with [facility] ENT if you are not feeling much better by Tuesday. Feel better.  FU with PCP for elevated BP

## 2017-04-07 ENCOUNTER — Other Ambulatory Visit: Payer: Self-pay | Admitting: Otolaryngology

## 2017-04-07 DIAGNOSIS — H7123 Cholesteatoma of mastoid, bilateral: Secondary | ICD-10-CM

## 2017-04-29 ENCOUNTER — Ambulatory Visit
Admission: RE | Admit: 2017-04-29 | Discharge: 2017-04-29 | Disposition: A | Discharge status: Home / Self Care | Payer: BLUE CROSS/BLUE SHIELD | Source: Ambulatory Visit | Attending: Otolaryngology | Admitting: Otolaryngology

## 2017-04-29 DIAGNOSIS — H7123 Cholesteatoma of mastoid, bilateral: Secondary | ICD-10-CM

## 2018-09-06 IMAGING — CT CT TEMPORAL BONES W/O CM
1 series · 14 of 27 positions shown, 18 images · non-contrast
Comparison: None.

CLINICAL DATA: Cholesteatoma of middle ear and mastoid, bilateral

EXAM:
CT TEMPORAL BONES WITHOUT CONTRAST
TECHNIQUE: Axial and coronal plane CT imaging of the petrous temporal bones was
performed with thin-collimation image reconstruction. No intravenous
contrast was administered. Multiplanar CT image reconstructions were
also generated.

[Series 4: brain · axial · 0.40mm/px · z∈[-242,-194]mm · 14 of 27 slices shown, 18 images]
[im 2/27  brain]
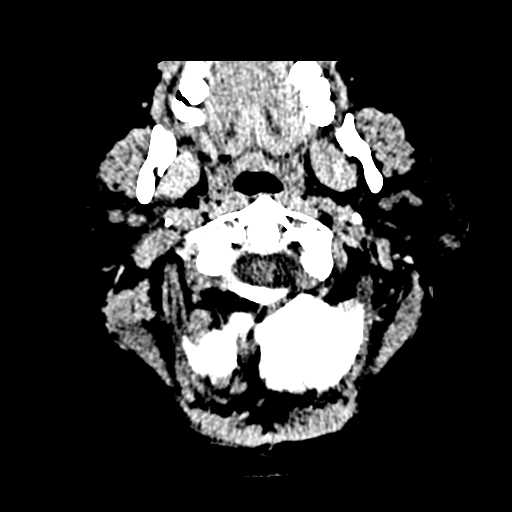
[im 2/27  bone]
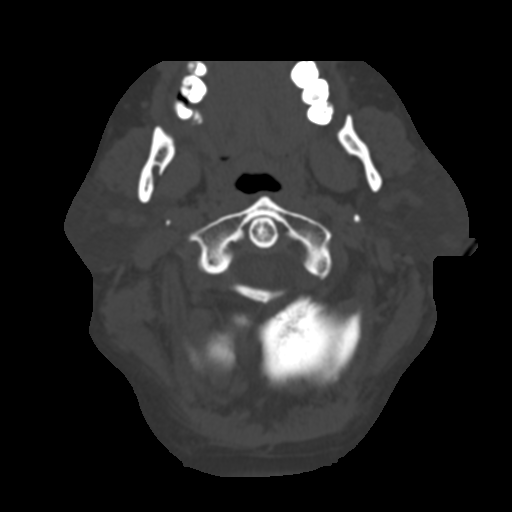
[im 4/27  bone]
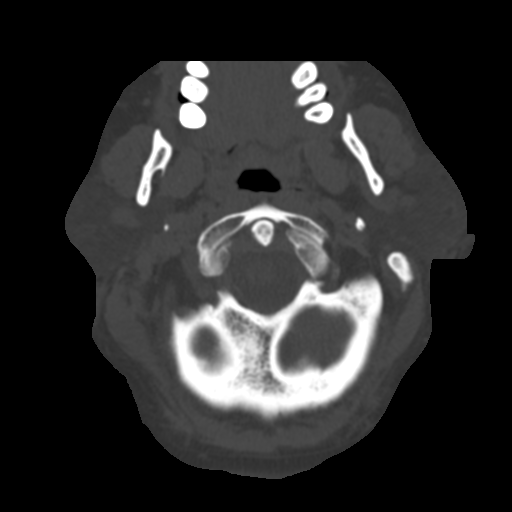
[im 6/27  bone]
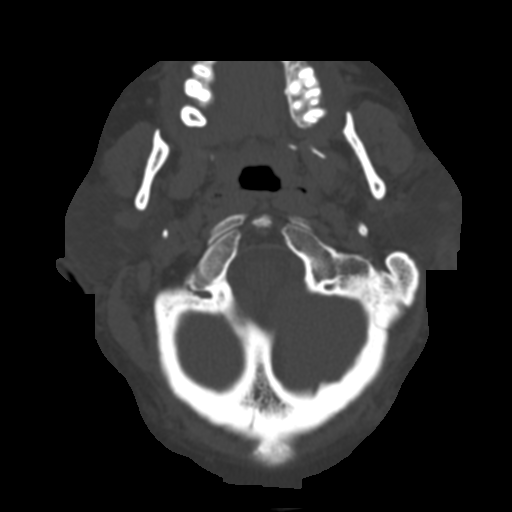
[im 8/27  bone]
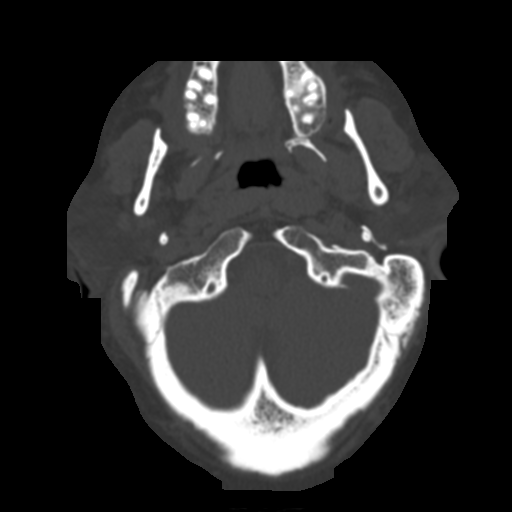
[im 10/27  brain]
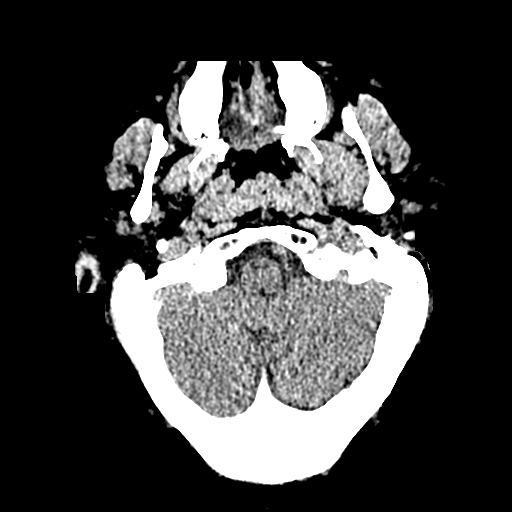
[im 10/27  bone]
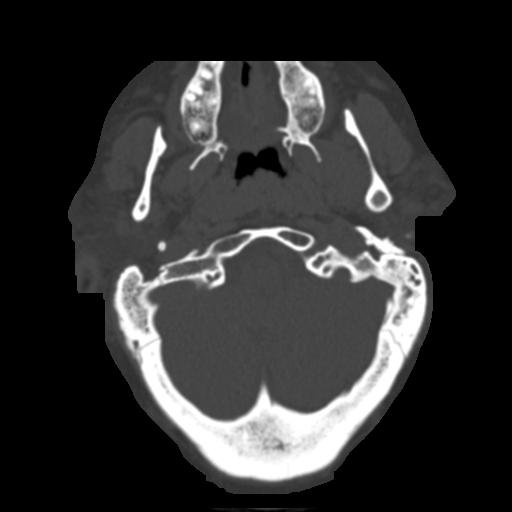
[im 11/27  bone]
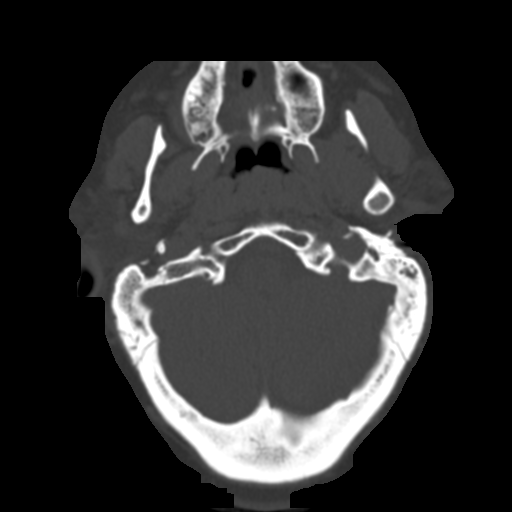
[im 13/27  bone]
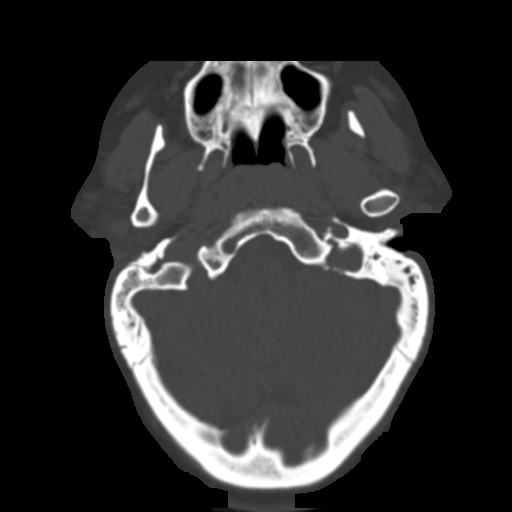
[im 15/27  bone]
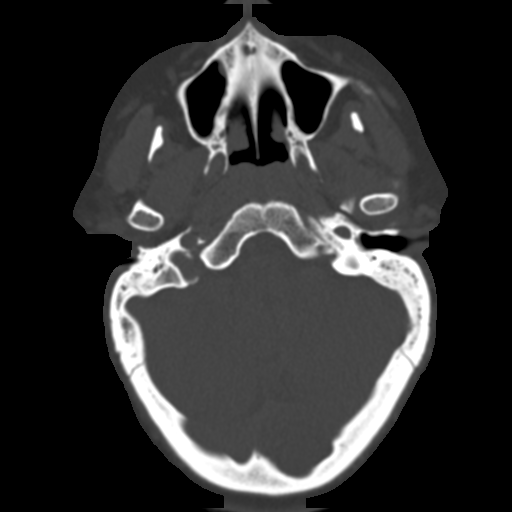
[im 17/27  brain]
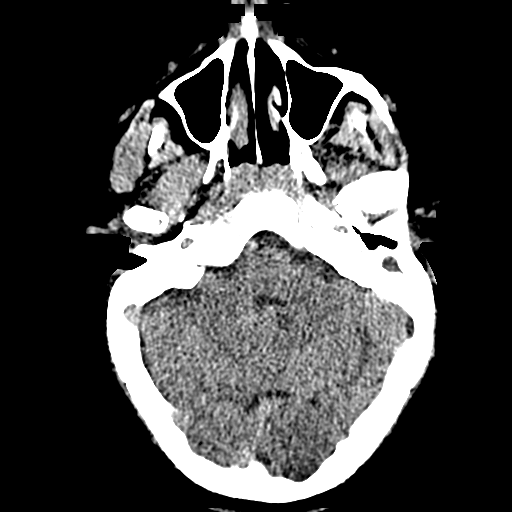
[im 17/27  bone]
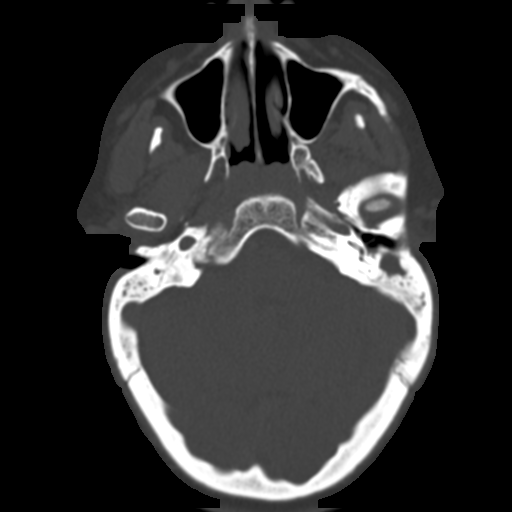
[im 18/27  bone]
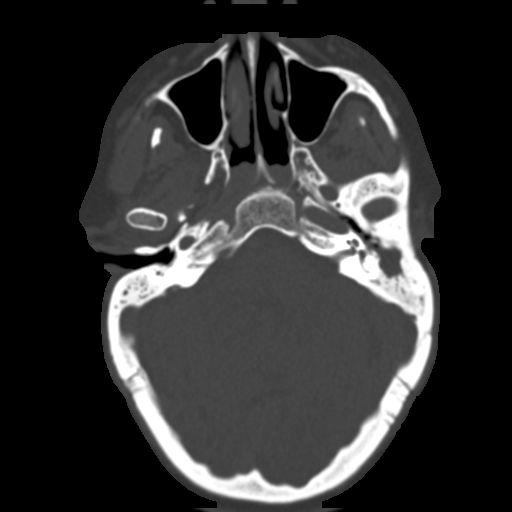
[im 20/27  bone]
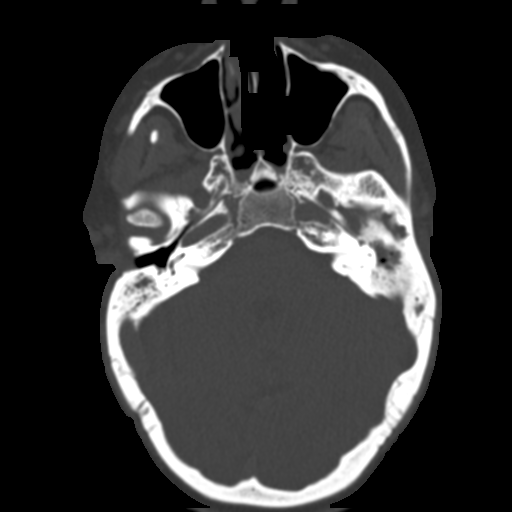
[im 22/27  bone]
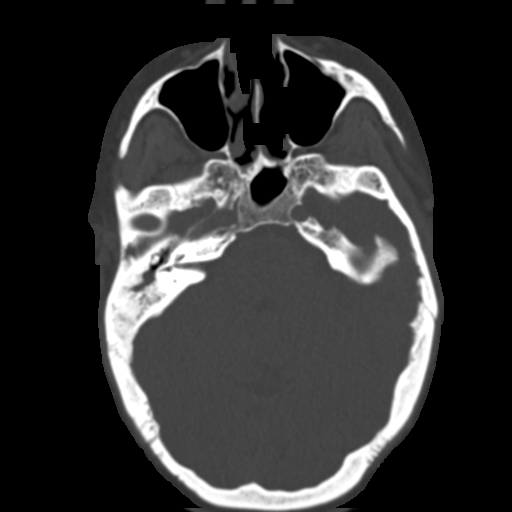
[im 24/27  brain]
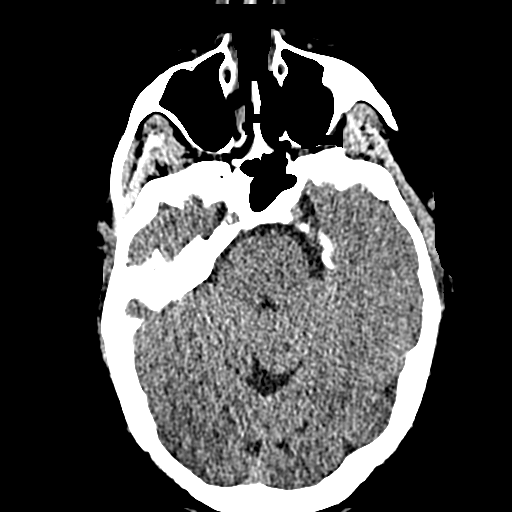
[im 24/27  bone]
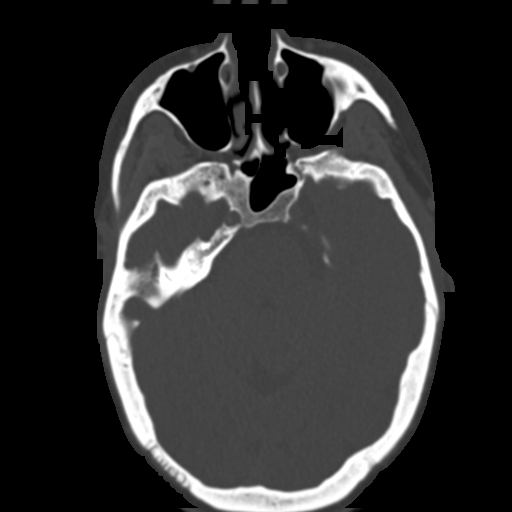
[im 26/27  bone]
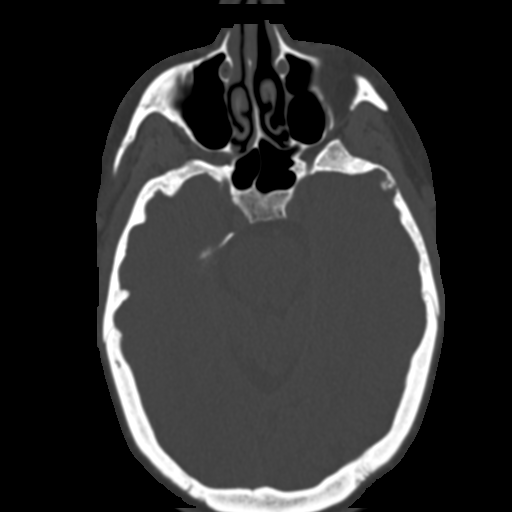

[14 of 27 positions shown; findings below may reference images not displayed]

FINDINGS: Right temporal bone: The Memhir and external auditory canal are
unremarkable. The tympanic membrane is imperceptible throughout most
of its extent and likely deficient. There is diffuse opacification
of the under pneumatized mastoid. Diffuse epitympanum opacification.
Short process of the incus is truncated. The stapes superstructure
is not well resolved. Scutum is intact. Normal density and
morphology of the labyrinthine structures with no otic capsule
erosion. Multifocal thin/imperceptible appearance of the tegmen
tympani and mastoideum on coronal reformats. Internal auditory canal
is normal in size. The vestibular aqueduct is normal in size. The
carotid and sigmoid sinus are bone covered. Unremarkable facial
nerve canal.

Left temporal bone: The Memhir and external auditory canal are
unremarkable. The tympanic membrane is not seen along most of its
course and is likely dehiscent. The under pneumatized mastoid is
predominantly opacified, as is the epitympanum more than
mesotympanum. The ossicular is deficient, with the only identifiable
structures being the lenticular process and stapes. Normal density
and morphology of the labyrinthine structures. Focally imperceptible
bony covering of the lateral semicircular canal.Intact tegmen
tympani and mastoideum Internal auditory canal is normal in size.
The vestibular aqueduct is normal in size. The carotid and sigmoid
sinus are bone covered. Unremarkable facial nerve canal.
IMPRESSION: 1. History of left temporal bone cholesteatoma with correlative
diffuse mastoid and epitympanum soft tissue density and ossicular
erosion. The otic capsule is thin/imperceptible at the level of the
lateral semicircular canal.
2. History of right cholesteatoma with correlative diffuse mastoid
and epitympanum soft tissue density and ossicular erosion. The otic
capsule is intact but there is multifocal imperceptible tegmen
tympani and mastoideum.

## 2023-05-25 ENCOUNTER — Emergency Department (HOSPITAL_COMMUNITY): Payer: BLUE CROSS/BLUE SHIELD

## 2023-05-25 ENCOUNTER — Other Ambulatory Visit: Payer: Self-pay

## 2023-05-25 ENCOUNTER — Encounter (HOSPITAL_COMMUNITY): Payer: Self-pay

## 2023-05-25 ENCOUNTER — Inpatient Hospital Stay (HOSPITAL_COMMUNITY)
Admission: EM | Admit: 2023-05-25 | Discharge: 2023-05-31 | DRG: 987 | Disposition: A | Discharge status: Home / Self Care | Payer: BLUE CROSS/BLUE SHIELD | Attending: Internal Medicine | Admitting: Internal Medicine

## 2023-05-25 DIAGNOSIS — J9811 Atelectasis: Secondary | ICD-10-CM | POA: Diagnosis present

## 2023-05-25 DIAGNOSIS — Z881 Allergy status to other antibiotic agents status: Secondary | ICD-10-CM

## 2023-05-25 DIAGNOSIS — R188 Other ascites: Secondary | ICD-10-CM | POA: Diagnosis present

## 2023-05-25 DIAGNOSIS — Z79890 Hormone replacement therapy (postmenopausal): Secondary | ICD-10-CM | POA: Diagnosis not present

## 2023-05-25 DIAGNOSIS — D390 Neoplasm of uncertain behavior of uterus: Secondary | ICD-10-CM | POA: Diagnosis not present

## 2023-05-25 DIAGNOSIS — Z87891 Personal history of nicotine dependence: Secondary | ICD-10-CM | POA: Diagnosis not present

## 2023-05-25 DIAGNOSIS — Z833 Family history of diabetes mellitus: Secondary | ICD-10-CM

## 2023-05-25 DIAGNOSIS — J810 Acute pulmonary edema: Secondary | ICD-10-CM | POA: Diagnosis present

## 2023-05-25 DIAGNOSIS — Z79899 Other long term (current) drug therapy: Secondary | ICD-10-CM

## 2023-05-25 DIAGNOSIS — Y92239 Unspecified place in hospital as the place of occurrence of the external cause: Secondary | ICD-10-CM | POA: Diagnosis not present

## 2023-05-25 DIAGNOSIS — Z800 Family history of malignant neoplasm of digestive organs: Secondary | ICD-10-CM | POA: Diagnosis not present

## 2023-05-25 DIAGNOSIS — J90 Pleural effusion, not elsewhere classified: Secondary | ICD-10-CM | POA: Diagnosis present

## 2023-05-25 DIAGNOSIS — Z880 Allergy status to penicillin: Secondary | ICD-10-CM | POA: Diagnosis not present

## 2023-05-25 DIAGNOSIS — E039 Hypothyroidism, unspecified: Secondary | ICD-10-CM | POA: Diagnosis present

## 2023-05-25 DIAGNOSIS — I82413 Acute embolism and thrombosis of femoral vein, bilateral: Secondary | ICD-10-CM | POA: Diagnosis present

## 2023-05-25 DIAGNOSIS — Z1152 Encounter for screening for COVID-19: Secondary | ICD-10-CM | POA: Diagnosis not present

## 2023-05-25 DIAGNOSIS — I2510 Atherosclerotic heart disease of native coronary artery without angina pectoris: Secondary | ICD-10-CM | POA: Diagnosis present

## 2023-05-25 DIAGNOSIS — C55 Malignant neoplasm of uterus, part unspecified: Secondary | ICD-10-CM | POA: Diagnosis present

## 2023-05-25 DIAGNOSIS — Z6835 Body mass index (BMI) 35.0-35.9, adult: Secondary | ICD-10-CM

## 2023-05-25 DIAGNOSIS — I82443 Acute embolism and thrombosis of tibial vein, bilateral: Secondary | ICD-10-CM | POA: Diagnosis present

## 2023-05-25 DIAGNOSIS — Z8051 Family history of malignant neoplasm of kidney: Secondary | ICD-10-CM | POA: Diagnosis not present

## 2023-05-25 DIAGNOSIS — Z9049 Acquired absence of other specified parts of digestive tract: Secondary | ICD-10-CM

## 2023-05-25 DIAGNOSIS — Z974 Presence of external hearing-aid: Secondary | ICD-10-CM

## 2023-05-25 DIAGNOSIS — R590 Localized enlarged lymph nodes: Secondary | ICD-10-CM | POA: Diagnosis present

## 2023-05-25 DIAGNOSIS — R350 Frequency of micturition: Secondary | ICD-10-CM | POA: Diagnosis not present

## 2023-05-25 DIAGNOSIS — I2699 Other pulmonary embolism without acute cor pulmonale: Principal | ICD-10-CM | POA: Diagnosis present

## 2023-05-25 DIAGNOSIS — E669 Obesity, unspecified: Secondary | ICD-10-CM | POA: Diagnosis present

## 2023-05-25 DIAGNOSIS — T501X5A Adverse effect of loop [high-ceiling] diuretics, initial encounter: Secondary | ICD-10-CM | POA: Diagnosis not present

## 2023-05-25 DIAGNOSIS — I2609 Other pulmonary embolism with acute cor pulmonale: Secondary | ICD-10-CM | POA: Diagnosis not present

## 2023-05-25 DIAGNOSIS — R971 Elevated cancer antigen 125 [CA 125]: Secondary | ICD-10-CM | POA: Diagnosis present

## 2023-05-25 DIAGNOSIS — I82453 Acute embolism and thrombosis of peroneal vein, bilateral: Secondary | ICD-10-CM | POA: Diagnosis present

## 2023-05-25 DIAGNOSIS — I82433 Acute embolism and thrombosis of popliteal vein, bilateral: Secondary | ICD-10-CM | POA: Diagnosis present

## 2023-05-25 DIAGNOSIS — C801 Malignant (primary) neoplasm, unspecified: Secondary | ICD-10-CM

## 2023-05-25 DIAGNOSIS — R0602 Shortness of breath: Principal | ICD-10-CM

## 2023-05-25 DIAGNOSIS — N858 Other specified noninflammatory disorders of uterus: Secondary | ICD-10-CM | POA: Diagnosis present

## 2023-05-25 DIAGNOSIS — D251 Intramural leiomyoma of uterus: Secondary | ICD-10-CM | POA: Diagnosis present

## 2023-05-25 DIAGNOSIS — I700 Atherosclerosis of aorta: Secondary | ICD-10-CM | POA: Diagnosis present

## 2023-05-25 DIAGNOSIS — J9601 Acute respiratory failure with hypoxia: Secondary | ICD-10-CM | POA: Diagnosis present

## 2023-05-25 DIAGNOSIS — C786 Secondary malignant neoplasm of retroperitoneum and peritoneum: Secondary | ICD-10-CM | POA: Diagnosis present

## 2023-05-25 DIAGNOSIS — K5900 Constipation, unspecified: Secondary | ICD-10-CM | POA: Diagnosis present

## 2023-05-25 DIAGNOSIS — R739 Hyperglycemia, unspecified: Secondary | ICD-10-CM | POA: Diagnosis present

## 2023-05-25 DIAGNOSIS — R000 Tachycardia, unspecified: Secondary | ICD-10-CM | POA: Diagnosis present

## 2023-05-25 DIAGNOSIS — Z888 Allergy status to other drugs, medicaments and biological substances status: Secondary | ICD-10-CM

## 2023-05-25 DIAGNOSIS — H9190 Unspecified hearing loss, unspecified ear: Secondary | ICD-10-CM | POA: Diagnosis present

## 2023-05-25 DIAGNOSIS — R609 Edema, unspecified: Secondary | ICD-10-CM | POA: Diagnosis not present

## 2023-05-25 HISTORY — DX: Other pulmonary embolism without acute cor pulmonale: I26.99

## 2023-05-25 LAB — BASIC METABOLIC PANEL
Anion gap: 11 (ref 5–15)
BUN: 8 mg/dL (ref 6–20)
CO2: 25 mmol/L (ref 22–32)
Calcium: 10.2 mg/dL (ref 8.9–10.3)
Chloride: 104 mmol/L (ref 98–111)
Creatinine, Ser: 0.63 mg/dL (ref 0.44–1.00)
GFR, Estimated: >60 mL/min (ref >60)
Glucose, Bld: 175 mg/dL — ABNORMAL HIGH (ref 70–99)
Potassium: 3.7 mmol/L (ref 3.5–5.1)
Sodium: 140 mmol/L (ref 135–145)

## 2023-05-25 LAB — HEPATIC FUNCTION PANEL
ALT: 29 U/L (ref 0–44)
AST: 27 U/L (ref 15–41)
Albumin: 3.9 g/dL (ref 3.5–5.0)
Alkaline Phosphatase: 105 U/L (ref 38–126)
Bilirubin, Direct: <0.1 mg/dL (ref 0.0–0.2)
Total Bilirubin: 0.6 mg/dL (ref 0.0–1.2)
Total Protein: 7.3 g/dL (ref 6.5–8.1)

## 2023-05-25 LAB — TSH: TSH: 6.108 u[IU]/mL — ABNORMAL HIGH (ref 0.350–4.500)

## 2023-05-25 LAB — CBC
HCT: 49.1 % — ABNORMAL HIGH (ref 36.0–46.0)
Hemoglobin: 15.5 g/dL — ABNORMAL HIGH (ref 12.0–15.0)
MCH: 29 pg (ref 26.0–34.0)
MCHC: 31.6 g/dL (ref 30.0–36.0)
MCV: 91.8 fL (ref 80.0–100.0)
Platelets: 270 10*3/uL (ref 150–400)
RBC: 5.35 MIL/uL — ABNORMAL HIGH (ref 3.87–5.11)
RDW: 13.2 % (ref 11.5–15.5)
WBC: 8.7 10*3/uL (ref 4.0–10.5)
nRBC: 0 % (ref 0.0–0.2)

## 2023-05-25 LAB — BRAIN NATRIURETIC PEPTIDE
B Natriuretic Peptide: 53.2 pg/mL (ref 0.0–100.0)
B Natriuretic Peptide: 17.1 pg/mL (ref 0.0–100.0)

## 2023-05-25 LAB — RESP PANEL BY RT-PCR (RSV, FLU A&B, COVID)  RVPGX2
Influenza A by PCR: NEGATIVE
Influenza B by PCR: NEGATIVE
Resp Syncytial Virus by PCR: NEGATIVE
SARS Coronavirus 2 by RT PCR: NEGATIVE

## 2023-05-25 LAB — D-DIMER, QUANTITATIVE: D-Dimer, Quant: 11.97 ug{FEU}/mL — ABNORMAL HIGH (ref 0.00–0.50)

## 2023-05-25 LAB — TROPONIN I (HIGH SENSITIVITY)
Troponin I (High Sensitivity): 2 ng/L (ref <18)
Troponin I (High Sensitivity): 2 ng/L (ref <18)

## 2023-05-25 LAB — T4, FREE: Free T4: 1.06 ng/dL (ref 0.61–1.12)

## 2023-05-25 LAB — HCG, SERUM, QUALITATIVE: Preg, Serum: NEGATIVE

## 2023-05-25 MED ORDER — ENOXAPARIN SODIUM 100 MG/ML IJ SOSY
100.0000 mg | PREFILLED_SYRINGE | INTRAMUSCULAR | Status: AC
  Administered 2023-05-25: 100 mg via SUBCUTANEOUS
  Filled 2023-05-25: qty 1

## 2023-05-25 MED ORDER — FUROSEMIDE 10 MG/ML IJ SOLN
40.0000 mg | Freq: Once | INTRAMUSCULAR | Status: AC
  Administered 2023-05-25: 40 mg via INTRAVENOUS
  Filled 2023-05-25: qty 4

## 2023-05-25 MED ORDER — ENOXAPARIN SODIUM 100 MG/ML IJ SOSY: 100.0000 mg | PREFILLED_SYRINGE | Freq: Two times a day (BID) | INTRAMUSCULAR | Status: DC

## 2023-05-25 MED ORDER — HEPARIN (PORCINE) 25000 UT/250ML-% IV SOLN
1900.0000 [IU]/h | INTRAVENOUS | Status: DC
  Administered 2023-05-26: 1300 [IU]/h via INTRAVENOUS
  Administered 2023-05-27: 1750 [IU]/h via INTRAVENOUS
  Administered 2023-05-28 – 2023-05-31 (×6): 1900 [IU]/h via INTRAVENOUS
  Filled 2023-05-25 (×9): qty 250

## 2023-05-25 MED ORDER — IOHEXOL 350 MG/ML SOLN
100.0000 mL | Freq: Once | INTRAVENOUS | Status: AC | PRN
  Administered 2023-05-25: 100 mL via INTRAVENOUS

## 2023-05-25 MED ORDER — FUROSEMIDE 10 MG/ML IJ SOLN
40.0000 mg | Freq: Two times a day (BID) | INTRAMUSCULAR | Status: DC
  Administered 2023-05-26 – 2023-05-28 (×6): 40 mg via INTRAVENOUS
  Filled 2023-05-25 (×6): qty 4

## 2023-05-25 MED ORDER — FUROSEMIDE 10 MG/ML IJ SOLN: 40.0000 mg | Freq: Every day | INTRAMUSCULAR | Status: DC

## 2023-05-25 MED ORDER — IOHEXOL 350 MG/ML SOLN
75.0000 mL | Freq: Once | INTRAVENOUS | Status: DC | PRN
Start: 2023-05-25 — End: 2023-05-25

## 2023-05-25 NOTE — ED Triage Notes (Addendum)
 Patient has been short of breath for 2 weeks off and on. This morning it worsened and now has left sided chest pain. No radiation. Last time she had issues with her breathing it was her thyroid acting up. Short of breath at rest and on exertion. Has trouble speaking in full sentences in triage.

## 2023-05-25 NOTE — ED Provider Notes (Addendum)
 [place] EMERGENCY DEPARTMENT AT [facility] Provider Note   CSN: [id number] Arrival date & time: 05/25/23  1035     History  Chief Complaint  Patient presents with   Shortness of Breath    Sharon Hensley is a 58 y.o. female.  Pt with general weakness, sob/doe, in the past month, with chest pain onset this AM, at rest. Mid chest/diffuse, dull/tightness/heaviness, non radiating, not pleuritic, constant. No other recent chest pain even w activity/exertion. No chest wall injury or strain. No fever or chills. Denies cough or uri symptoms. No new leg pain or swelling. No hx cad. No hx dvt or pe. Non smoker. Compliant w home meds.   The history is provided by the patient, a significant other and medical records.  Shortness of Breath Associated symptoms: chest pain   Associated symptoms: no abdominal pain, no cough, no fever, no headaches, no neck pain, no rash, no sore throat and no vomiting        Home Medications Prior to Admission medications   Medication Sig Start Date End Date Taking? Authorizing Provider  famotidine-calcium carbonate-magnesium hydroxide (PEPCID COMPLETE) [phone] MG CHEW chewable tablet Chew 1 tablet by mouth at bedtime as needed (heartburn).    [provider]  ferrous sulfate 325 (65 FE) MG tablet Take 325 mg by mouth 3 (three) times daily with meals.     [provider]  fluticasone (FLONASE) 50 MCG/ACT nasal spray Place 1 spray into both nostrils daily. 03/02/17   Georgetta Haber, NP  levothyroxine (SYNTHROID, LEVOTHROID) 137 MCG tablet Take 137 mcg by mouth daily before breakfast.    [provider]  loratadine (CLARITIN) 10 MG tablet Take 10 mg by mouth daily.    [provider]  Multiple Vitamin (MULTIVITAMIN WITH MINERALS) TABS tablet Take 1 tablet by mouth daily.    [provider]  omeprazole (PRILOSEC) 20 MG capsule Take 20 mg by mouth 2 (two) times daily before a meal.     [provider]   ondansetron (ZOFRAN) 4 MG tablet Take 1 tablet (4 mg total) by mouth every 8 (eight) hours as needed for nausea or vomiting. 03/07/17   Chrystie Nose M, PA-C  oxymetazoline (AFRIN) 0.05 % nasal spray Place 1 spray into both nostrils 2 (two) times daily.    [provider]  predniSONE (STERAPRED UNI-PAK 21 TAB) 5 MG (21) TBPK tablet Take as directed 03/07/17   Tarri Glenn, Deanna M, PA-C      Allergies    Clindamycin/lincomycin, Penicillins, and Veetids [penicillin v]    Review of Systems   Review of Systems  Constitutional:  Negative for chills and fever.  HENT:  Negative for sore throat.   Respiratory:  Positive for shortness of breath. Negative for cough.   Cardiovascular:  Positive for chest pain. Negative for palpitations and leg swelling.  Gastrointestinal:  Negative for abdominal pain, nausea and vomiting.  Genitourinary:  Negative for dysuria and flank pain.  Musculoskeletal:  Negative for back pain and neck pain.  Skin:  Negative for rash.  Neurological:  Negative for headaches.    Physical Exam Updated Vital Signs BP (!) 166/87   Pulse (!) 105   Temp 98.7 F (37.1 C) (Oral)   Resp (!) 27   Ht 1.626 m (5\' 4" )   Wt 94.8 kg   LMP 10/05/2016   SpO2 91%   BMI 35.87 kg/m  Physical Exam Vitals and nursing note reviewed.  Constitutional:  Appearance: Normal appearance. She is well-developed.  HENT:     Head: Atraumatic.     Nose: Nose normal.     Mouth/Throat:     Mouth: Mucous membranes are moist.     Pharynx: Oropharynx is clear.  Eyes:     General: No scleral icterus.    Conjunctiva/sclera: Conjunctivae normal.  Neck:     Trachea: No tracheal deviation.     Comments: Trachea midline. Thyroid not grossly enlarged or tender.  Cardiovascular:     Rate and Rhythm: Regular rhythm. Tachycardia present.     Pulses: Normal pulses.     Heart sounds: Normal heart sounds. No murmur heard.    No friction rub. No gallop.  Pulmonary:     Effort:  Pulmonary effort is normal. No respiratory distress.     Breath sounds: Normal breath sounds.  Abdominal:     General: Bowel sounds are normal. There is no distension.     Palpations: Abdomen is soft. There is no mass.     Tenderness: There is no abdominal tenderness. There is no guarding.  Genitourinary:    Comments: No cva tenderness.  Musculoskeletal:        General: No swelling or tenderness.     Cervical back: Normal range of motion and neck supple. No rigidity. No muscular tenderness.     Right lower leg: No edema.     Left lower leg: No edema.  Skin:    General: Skin is warm and dry.     Findings: No rash.  Neurological:     Mental Status: She is alert.     Comments: Alert, speech normal.   Psychiatric:        Mood and Affect: Mood normal.     ED Results / Procedures / Treatments   Labs (all labs ordered are listed, but only abnormal results are displayed) Results for orders placed or performed during the hospital encounter of 05/25/23  Basic metabolic panel   Collection Time: 05/25/23 11:07 AM  Result Value Ref Range   Sodium 140 135 - 145 mmol/L   Potassium 3.7 3.5 - 5.1 mmol/L   Chloride 104 98 - 111 mmol/L   CO2 25 22 - 32 mmol/L   Glucose, Bld 175 (H) 70 - 99 mg/dL   BUN 8 6 - 20 mg/dL   Creatinine, Ser 1.61 0.44 - 1.00 mg/dL   Calcium 09.6 8.9 - 04.5 mg/dL   GFR, Estimated >40 >98 mL/min   Anion gap 11 5 - 15  CBC   Collection Time: 05/25/23 11:07 AM  Result Value Ref Range   WBC 8.7 4.0 - 10.5 K/uL   RBC 5.35 (H) 3.87 - 5.11 MIL/uL   Hemoglobin 15.5 (H) 12.0 - 15.0 g/dL   HCT 11.9 (H) 14.7 - 82.9 %   MCV 91.8 80.0 - 100.0 fL   MCH 29.0 26.0 - 34.0 pg   MCHC 31.6 30.0 - 36.0 g/dL   RDW 56.2 13.0 - 86.5 %   Platelets 270 150 - 400 K/uL   nRBC 0.0 0.0 - 0.2 %  hCG, serum, qualitative   Collection Time: 05/25/23 11:07 AM  Result Value Ref Range   Preg, Serum NEGATIVE NEGATIVE  Brain natriuretic peptide   Collection Time: 05/25/23 11:07 AM  Result  Value Ref Range   B Natriuretic Peptide 53.2 0.0 - 100.0 pg/mL  TSH   Collection Time: 05/25/23 11:07 AM  Result Value Ref Range   TSH 6.108 (H) 0.350 - 4.500  uIU/mL  T4, free   Collection Time: 05/25/23 11:07 AM  Result Value Ref Range   Free T4 1.06 0.61 - 1.12 ng/dL  Troponin I (High Sensitivity)   Collection Time: 05/25/23 11:07 AM  Result Value Ref Range   Troponin I (High Sensitivity) 2 <18 ng/L  Resp panel by RT-PCR (RSV, Flu A&B, Covid) Anterior Nasal Swab   Collection Time: 05/25/23 12:12 PM   Specimen: Anterior Nasal Swab  Result Value Ref Range   SARS Coronavirus 2 by RT PCR NEGATIVE NEGATIVE   Influenza A by PCR NEGATIVE NEGATIVE   Influenza B by PCR NEGATIVE NEGATIVE   Resp Syncytial Virus by PCR NEGATIVE NEGATIVE  D-dimer, quantitative   Collection Time: 05/25/23 12:22 PM  Result Value Ref Range   D-Dimer, Quant 11.97 (H) 0.00 - 0.50 ug/mL-FEU  Troponin I (High Sensitivity)   Collection Time: 05/25/23  1:41 PM  Result Value Ref Range   Troponin I (High Sensitivity) 2 <18 ng/L     EKG EKG Interpretation Date/Time:  Monday May 25 2023 10:39:49 EST Ventricular Rate:  100 PR Interval:  152 QRS Duration:  90 QT Interval:  325 QTC Calculation: 420 R Axis:   56  Text Interpretation: Sinus tachycardia Baseline wander Confirmed by Cathren Laine ([id number]) on 05/25/2023 12:18:31 PM  Radiology CT ABDOMEN PELVIS W CONTRAST Result Date: 05/25/2023 CLINICAL DATA:  Abdominal pain, acute, nonlocalized. EXAM: CT ABDOMEN AND PELVIS WITH CONTRAST TECHNIQUE: Multidetector CT imaging of the abdomen and pelvis was performed using the standard protocol following bolus administration of intravenous contrast. RADIATION DOSE REDUCTION: This exam was performed according to the departmental dose-optimization program which includes automated exposure control, adjustment of the mA and/or kV according to patient size and/or use of iterative reconstruction technique. CONTRAST:   OMNIPAQUE IOHEXOL 350 MG/ML SOLN COMPARISON:  CT scan abdomen and pelvis from 04/01/2003. FINDINGS: Lower chest: Bilateral moderate to large pleural effusions noted with associated compressive atelectatic changes. The heart is normal in size. No pericardial effusion. Hepatobiliary: The liver is normal in size. Non-cirrhotic configuration. There are at least 2 simple cysts in the liver with largest measuring up to 3.3 x 3.4 cm. The cysts are present since the prior study dating back to 2005 and exhibits significant interval growth. No intrahepatic or extrahepatic bile duct dilation. Gallbladder is surgically absent. Pancreas: Unremarkable. No pancreatic ductal dilatation or surrounding inflammatory changes. Spleen: Within normal limits. No focal lesion. Adrenals/Urinary Tract: Adrenal glands are unremarkable. No suspicious renal mass. No hydronephrosis. No renal or ureteric calculi. Urinary bladder is under distended, precluding optimal assessment. However, no large mass or stones identified. No perivesical fat stranding. Stomach/Bowel: No disproportionate dilation of the small or large bowel loops. No evidence of abnormal bowel wall thickening or inflammatory changes. The appendix is unremarkable. Vascular/Lymphatic: There is small-to-moderate ascites mainly in the perihepatic/perisplenic region, dependent pelvis and bilateral paracolic gutters. There are soft tissue nodularity/stranding in the upper abdomen, which is nonspecific but can be seen with ascites or due to peritoneal carcinomatosis. No pneumoperitoneum. There are mildly enlarged retroperitoneal lymph nodes, which are indeterminate in etiology. No aneurysmal dilation of the major abdominal arteries. There are mild peripheral atherosclerotic vascular calcifications of the aorta and its major branches. Reproductive: The uterus is enlarged secondary to a markedly enlarged 12.9 x 13.7 cm heterogeneous hypoattenuating area in the fundus/uterine body region.  The uterus is probably pushed to the right and inferiorly however, not well evaluated. Endometrium is not seen. Bilateral ovaries are not distinctly visualized  on this exam however, no large adnexal mass seen. Other: There is a small fat containing umbilical hernia. The soft tissues and abdominal wall are otherwise unremarkable. Musculoskeletal: No suspicious osseous lesions. There are mild multilevel degenerative changes in the visualized spine. IMPRESSION: 1. Enlarged uterus secondary to a large heterogeneous hypoattenuating area in the fundus/uterine body region. This is not well evaluated on the current exam. Differential diagnosis includes benign versus malignant uterine mass. Correlate clinically. Consider additional imaging as indicated. 2. There is small-to-moderate ascites. There is also soft tissue nodularity/stranding in the upper abdomen, which is nonspecific but can be seen with ascites or due to peritoneal carcinomatosis. 3. Bilateral moderate to large pleural effusions with associated compressive atelectatic changes. 4. Multiple other nonacute observations, as described above. Aortic Atherosclerosis (ICD10-I70.0). Electronically Signed   By: Jules Schick M.D.   On: 05/25/2023 15:44   DG Chest Port 1 View Result Date: 05/25/2023 CLINICAL DATA:  Shortness of breath. EXAM: PORTABLE CHEST 1 VIEW COMPARISON:  07/20/2013. FINDINGS: Low lung volume. Mild diffuse pulmonary vascular congestion, likely accentuated by low lung volume. No frank pulmonary edema. There are bilateral small-to-moderate pleural effusions with probable underlying compressive atelectatic changes. No pneumothorax. Normal cardio-mediastinal silhouette. No acute osseous abnormalities. The soft tissues are within normal limits. IMPRESSION: Findings concerning for congestive heart failure/pulmonary edema. Correlate clinically. Electronically Signed   By: Jules Schick M.D.   On: 05/25/2023 12:28    Procedures Procedures     Medications Ordered in ED Medications  iohexol (OMNIPAQUE) 350 MG/ML injection 100 mL (100 mLs Intravenous Contrast Given 05/25/23 1433)    ED Course/ Medical Decision Making/ A&P                                 Medical Decision Making Problems Addressed: Acute respiratory failure with hypoxia [facility]): acute illness or injury with systemic symptoms that poses a threat to life or bodily functions Bilateral pleural effusion: acute illness or injury Bilateral pulmonary embolism (HCC): acute illness or injury with systemic symptoms that poses a threat to life or bodily functions Other ascites: acute illness or injury Shortness of breath: acute illness or injury with systemic symptoms that poses a threat to life or bodily functions Sinus tachycardia: acute illness or injury Uterine mass: acute illness or injury with systemic symptoms that poses a threat to life or bodily functions  Amount and/or Complexity of Data Reviewed Independent Historian:     Details: Fam/friend, hx External Data Reviewed: notes. Labs: ordered. Decision-making details documented in ED Course. Radiology: ordered and independent interpretation performed. Decision-making details documented in ED Course. ECG/medicine tests: ordered and independent interpretation performed. Decision-making details documented in ED Course. Discussion of management or test interpretation with external provider(s): medicine  Risk Prescription drug management. Parenteral controlled substances. Decision regarding hospitalization.   Iv ns. Continuous pulse ox and cardiac monitoring. Labs ordered/sent. Imaging ordered.   Differential diagnosis includes acs, chf, pna, covid, flu, PE, etc. Dispo decision including potential need for admission considered - will get labs and imaging and reassess.   Reviewed nursing notes and prior charts for additional history. External reports reviewed. Additional history from: signif other.   Cardiac  monitor: sinus rhythm, rate 104.   Room air pulse ox is 87%. 2 liters sats 94%.   Labs reviewed/interpreted by me - bnp normal. Wbc and hgb normal. Bnp normal. Trop normal. Ddimer is high - will get cta.   Xrays reviewed/interpreted  by me - no pna. ?>vascular congestion.   CT reviewed/interpreted by me - +PE, + uterine mass - concerning for malignancy.   Heparin per pharmacy.   Hospitalists consulted for admission. Call back pending - signed out to Dr Earlene Plater to facilitate admission.   CRITICAL CARE RE: acute resp failure with hypoxia, bilateral PE, large uterine mass with ascites.  Performed by: Suzi Roots Total critical care time: 45 minutes Critical care time was exclusive of separately billable procedures and treating other patients. Critical care was necessary to treat or prevent imminent or life-threatening deterioration. Critical care was time spent personally by me on the following activities: development of treatment plan with patient and/or surrogate as well as nursing, discussions with consultants, evaluation of patient's response to treatment, examination of patient, obtaining history from patient or surrogate, ordering and performing treatments and interventions, ordering and review of laboratory studies, ordering and review of radiographic studies, pulse oximetry and re-evaluation of patient's condition.           Final Clinical Impression(s) / ED Diagnoses Final diagnoses:  Shortness of breath  Sinus tachycardia    Rx / DC Orders ED Discharge Orders     None          Cathren Laine, MD 05/25/23 1712

## 2023-05-25 NOTE — H&P (Signed)
 History and Physical    Del Wiseman WUJ:[id number] DOB: November 05, 1965 DOA: 05/25/2023  PCP: Gwenyth Bender, FNP  Patient coming from: home  I have personally briefly reviewed patient's old medical records in [facility]  Chief Complaint: shortness of breath  HPI: Sharon Herandez is Sharon Hensley 58 y.o. female with medical history significant of hypothyroidism presenting with Elson Ulbrich few months of gradually progressive symptoms including shortness of breath.  History is obtained with assistance of her partner.  She notes things have been going on for Couper Juncaj few months.  Her symptoms started with back spasms, she noted progressive shortness of breath, then constipation.  She's also noted chest pain within the past few days.  This morning she noted progressive shortness of breath.  She noted the left side of her chest felt heavy.  Her SOB is both at rest and with activity.  She has chronic orthopnea. Has been sleeping in Sharon Hensley chair for years.  Orthopnea may have gotten worse in the past month or so.  She's noticed swelling in the past few weeks, notes she was started on hydrochlorothiazide, but this was d/c'd because she didn't like how it made her feel.  Her chest pain started within the past few days, initially she thought it was reflux, but more recently it's been more like pressure.  She denies fevers, chills.  Denies abdominal pain, nausea, vomiting.    ED Course: labs, imaging.  PE on imaging.  Started on lovenox.  Admit to hospitalist.   Review of Systems: As per HPI otherwise all other systems reviewed and are negative.  Past Medical History:  Diagnosis Date   Anemia    Goiter    Temporary low platelet count (HCC)     Past Surgical History:  Procedure Laterality Date   CHOLECYSTECTOMY      Social History  reports that she has quit smoking. Her smoking use included cigarettes. She has never used smokeless tobacco. She reports that she does not drink alcohol and does not use drugs.  Allergies  Allergen  Reactions   Clindamycin/Lincomycin Anaphylaxis   Hydrochlorothiazide Other (See Comments)    "Made me feel like cement"- patient does not wish to take this again   Penicillins Hives, Itching and Other (See Comments)    Patient said she can tolerate Amoxicillin   Tape Rash    Family History  Problem Relation Age of Onset   Diabetes Mother    Diabetes Other    Prior to Admission medications   Medication Sig Start Date End Date Taking? Authorizing Provider  Acetaminophen Extra Strength 500 MG CAPS Take 500-1,000 mg by mouth See admin instructions. Tylenol Rapid Release 500 mg capsules- Take 500-1,000 mg by mouth every 8 hours as needed for headaches or pain   Yes [provider]  ADVIL 200 MG CAPS Take 200-400 mg by mouth every 8 (eight) hours as needed (for pain or headaches).   Yes [provider]  famotidine-calcium carbonate-magnesium hydroxide (PEPCID COMPLETE) 10-800-165 MG CHEW chewable tablet Chew 1 tablet by mouth at bedtime as needed (heartburn).   Yes [provider]  levothyroxine (SYNTHROID) 150 MCG tablet Take 150 mcg by mouth See admin instructions. Take 150 mcg by mouth in the morning 30 minutes before breakfast on Mon/Tues/Wed/Thurs/Fri/Sat- skip Sundays   Yes [provider]  Misc Natural Products (YUMVS BEET ROOT-TART CHERRY) 250-0.5 MG CHEW Chew 2 tablets by mouth daily.   Yes [provider]  oxymetazoline (AFRIN) 0.05 % nasal spray Place 1  spray into both nostrils 2 (two) times daily as needed for congestion.   Yes [provider]  fluticasone (FLONASE) 50 MCG/ACT nasal spray Place 1 spray into both nostrils daily. Patient not taking: Reported on 05/25/2023 03/02/17   Linus Mako B, NP  ondansetron (ZOFRAN) 4 MG tablet Take 1 tablet (4 mg total) by mouth every 8 (eight) hours as needed for nausea or vomiting. Patient not taking: Reported on 05/25/2023 03/07/17   Tarri Glenn, Deanna M, PA-C  predniSONE (STERAPRED  UNI-PAK 21 TAB) 5 MG (21) TBPK tablet Take as directed Patient not taking: Reported on 05/25/2023 03/07/17   Laddie Aquas, PA-C    Physical Exam: Vitals:   05/25/23 1630 05/25/23 1730 05/25/23 1805 05/25/23 1945  BP: (!) 160/81 (!) 155/100  (!) 152/75  Pulse: (!) 101 (!) 103  99  Resp: (!) 25 (!) 24  (!) 26  Temp:   98.2 F (36.8 C) 98.2 F (36.8 C)  TempSrc:   Oral   SpO2: 92% 90%  92%  Weight:      Height:        Constitutional: NAD, calm, comfortable Vitals:   05/25/23 1630 05/25/23 1730 05/25/23 1805 05/25/23 1945  BP: (!) 160/81 (!) 155/100  (!) 152/75  Pulse: (!) 101 (!) 103  99  Resp: (!) 25 (!) 24  (!) 26  Temp:   98.2 F (36.8 C) 98.2 F (36.8 C)  TempSrc:   Oral   SpO2: 92% 90%  92%  Weight:      Height:       Eyes: PERRL, lids and conjunctivae normal ENMT: Mucous membranes are moist.  Neck: normal, supple Respiratory: diminished, unlabored Cardiovascular: Regular rate and rhythm, no murmurs / rubs / gallops. Mild lower extremity edema. Abdomen: distended, not tense, no TTP Musculoskeletal: no clubbing / cyanosis. No joint deformity upper and lower extremities. Good ROM, no contractures. Normal muscle tone.  Skin: no rashes, lesions, ulcers appreciated Neurologic: CN 2-12 grossly intact. Moving all extremities.   Psychiatric: Normal judgment and insight. Alert and oriented x 3. Normal mood.   Labs on Admission: I have personally reviewed following labs and imaging studies  CBC: Recent Labs  Lab 05/25/23 1107  WBC 8.7  HGB 15.5*  HCT 49.1*  MCV 91.8  PLT 270    Basic Metabolic Panel: Recent Labs  Lab 05/25/23 1107  NA 140  K 3.7  CL 104  CO2 25  GLUCOSE 175*  BUN 8  CREATININE 0.63  CALCIUM 10.2    GFR: Estimated Creatinine Clearance: 86.6 mL/min (by C-G formula based on SCr of 0.63 mg/dL).  Liver Function Tests: No results for input(s): "AST", "ALT", "ALKPHOS", "BILITOT", "PROT", "ALBUMIN" in the last 168 hours.  Urine  analysis:    Component Value Date/Time   COLORURINE YELLOW 08/17/2013 0130   APPEARANCEUR TURBID (Raelie Lohr) 08/17/2013 0130   LABSPEC 1.010 08/17/2013 0130   PHURINE 8.0 08/17/2013 0130   GLUCOSEU NEGATIVE 08/17/2013 0130   HGBUR MODERATE (Kimla Furth) 08/17/2013 0130   BILIRUBINUR NEGATIVE 08/17/2013 0130   KETONESUR NEGATIVE 08/17/2013 0130   PROTEINUR NEGATIVE 08/17/2013 0130   UROBILINOGEN 0.2 08/17/2013 0130   NITRITE NEGATIVE 08/17/2013 0130   LEUKOCYTESUR NEGATIVE 08/17/2013 0130    Radiological Exams on Admission: CT Angio Chest PE W/Cm &/Or Wo Cm Result Date: 05/25/2023 CLINICAL DATA:  Intermittent shortness of breath for 2 weeks, left-sided chest pain EXAM: CT ANGIOGRAPHY CHEST WITH CONTRAST TECHNIQUE: Multidetector CT imaging of the chest was performed using  the standard protocol during bolus administration of intravenous contrast. Multiplanar CT image reconstructions and MIPs were obtained to evaluate the vascular anatomy. RADIATION DOSE REDUCTION: This exam was performed according to the departmental dose-optimization program which includes automated exposure control, adjustment of the mA and/or kV according to patient size and/or use of iterative reconstruction technique. CONTRAST:  OMNIPAQUE IOHEXOL 350 MG/ML SOLN COMPARISON:  05/25/2023 FINDINGS: Cardiovascular: This is Jerett Odonohue technically adequate evaluation of the pulmonary vasculature. There are bilateral pulmonary emboli, primarily within the segmental vessels of the left upper, right upper, and right lower lobes. Mild to moderate clot burden. No evidence of right heart strain. The heart is unremarkable without pericardial effusion. No evidence of thoracic aortic aneurysm or dissection. Atherosclerosis of the aorta and coronary vasculature. Mediastinum/Nodes: Nonspecific subcentimeter lymph nodes throughout the mediastinum and hilar regions. Thyroid, trachea, and esophagus are grossly unremarkable. Lungs/Pleura: Moderate bilateral pleural  effusions, right greater than left, with significant compressive atelectasis within the bilateral lower lobes. There is also complete consolidation with volume loss in the right middle lobe compatible with atelectasis. This is of uncertain chronicity. No evidence of central obstructing mass. No airspace disease or pneumothorax.  Central airways are patent. Upper Abdomen: Small volume ascites.  Multiple hepatic cysts. Musculoskeletal: No acute or destructive bony abnormalities. Reconstructed images demonstrate no additional findings. Review of the MIP images confirms the above findings. IMPRESSION: 1. Bilateral segmental pulmonary emboli. Mild moderate clot burden with no evidence of right heart strain. 2. Moderate bilateral pleural effusions, with extensive compressive atelectasis throughout the lower lobes. 3. Complete atelectasis of the right middle lobe, of uncertain chronicity. No central obstructing lesion identified. 4. Numerous subcentimeter mediastinal and hilar lymph nodes, nonspecific. No pathologic adenopathy. 5. Upper abdominal ascites. 6. Aortic Atherosclerosis (ICD10-I70.0). Coronary artery atherosclerosis. Critical Value/emergent results were called by telephone at the time of interpretation on 05/25/2023 at 3:46 pm to provider [facility] , who verbally acknowledged these results. Electronically Signed   By: Sharlet Salina M.D.   On: 05/25/2023 15:54   CT ABDOMEN PELVIS W CONTRAST Result Date: 05/25/2023 CLINICAL DATA:  Abdominal pain, acute, nonlocalized. EXAM: CT ABDOMEN AND PELVIS WITH CONTRAST TECHNIQUE: Multidetector CT imaging of the abdomen and pelvis was performed using the standard protocol following bolus administration of intravenous contrast. RADIATION DOSE REDUCTION: This exam was performed according to the departmental dose-optimization program which includes automated exposure control, adjustment of the mA and/or kV according to patient size and/or use of iterative reconstruction  technique. CONTRAST:  OMNIPAQUE IOHEXOL 350 MG/ML SOLN COMPARISON:  CT scan abdomen and pelvis from 04/01/2003. FINDINGS: Lower chest: Bilateral moderate to large pleural effusions noted with associated compressive atelectatic changes. The heart is normal in size. No pericardial effusion. Hepatobiliary: The liver is normal in size. Non-cirrhotic configuration. There are at least 2 simple cysts in the liver with largest measuring up to 3.3 x 3.4 cm. The cysts are present since the prior study dating back to 2005 and exhibits significant interval growth. No intrahepatic or extrahepatic bile duct dilation. Gallbladder is surgically absent. Pancreas: Unremarkable. No pancreatic ductal dilatation or surrounding inflammatory changes. Spleen: Within normal limits. No focal lesion. Adrenals/Urinary Tract: Adrenal glands are unremarkable. No suspicious renal mass. No hydronephrosis. No renal or ureteric calculi. Urinary bladder is under distended, precluding optimal assessment. However, no large mass or stones identified. No perivesical fat stranding. Stomach/Bowel: No disproportionate dilation of the small or large bowel loops. No evidence of abnormal bowel wall thickening or inflammatory changes. The appendix  is unremarkable. Vascular/Lymphatic: There is small-to-moderate ascites mainly in the perihepatic/perisplenic region, dependent pelvis and bilateral paracolic gutters. There are soft tissue nodularity/stranding in the upper abdomen, which is nonspecific but can be seen with ascites or due to peritoneal carcinomatosis. No pneumoperitoneum. There are mildly enlarged retroperitoneal lymph nodes, which are indeterminate in etiology. No aneurysmal dilation of the major abdominal arteries. There are mild peripheral atherosclerotic vascular calcifications of the aorta and its major branches. Reproductive: The uterus is enlarged secondary to Kristinia Leavy markedly enlarged 12.9 x 13.7 cm heterogeneous hypoattenuating area in the  fundus/uterine body region. The uterus is probably pushed to the right and inferiorly however, not well evaluated. Endometrium is not seen. Bilateral ovaries are not distinctly visualized on this exam however, no large adnexal mass seen. Other: There is Areal Cochrane small fat containing umbilical hernia. The soft tissues and abdominal wall are otherwise unremarkable. Musculoskeletal: No suspicious osseous lesions. There are mild multilevel degenerative changes in the visualized spine. IMPRESSION: 1. Enlarged uterus secondary to Graciela Plato large heterogeneous hypoattenuating area in the fundus/uterine body region. This is not well evaluated on the current exam. Differential diagnosis includes benign versus malignant uterine mass. Correlate clinically. Consider additional imaging as indicated. 2. There is small-to-moderate ascites. There is also soft tissue nodularity/stranding in the upper abdomen, which is nonspecific but can be seen with ascites or due to peritoneal carcinomatosis. 3. Bilateral moderate to large pleural effusions with associated compressive atelectatic changes. 4. Multiple other nonacute observations, as described above. Aortic Atherosclerosis (ICD10-I70.0). Electronically Signed   By: Jules Schick M.D.   On: 05/25/2023 15:44   DG Chest Port 1 View Result Date: 05/25/2023 CLINICAL DATA:  Shortness of breath. EXAM: PORTABLE CHEST 1 VIEW COMPARISON:  07/20/2013. FINDINGS: Low lung volume. Mild diffuse pulmonary vascular congestion, likely accentuated by low lung volume. No frank pulmonary edema. There are bilateral small-to-moderate pleural effusions with probable underlying compressive atelectatic changes. No pneumothorax. Normal cardio-mediastinal silhouette. No acute osseous abnormalities. The soft tissues are within normal limits. IMPRESSION: Findings concerning for congestive heart failure/pulmonary edema. Correlate clinically. Electronically Signed   By: Jules Schick M.D.   On: 05/25/2023 12:28    EKG:  Independently reviewed. Sinus tachy  Assessment/Plan Principal Problem:   Pulmonary embolism (HCC)    Assessment and Plan:  Acute hypoxic respiratory failure Requiring 5 L [place] on my exam, satting in high 80's low 90's Multifactorial related to PE, bilateral effusions As below  Bilateral segmental Pulmonary Emboli No obvious provoking factor to this point (ongoing eval) S/p lovenox in ED -> will transition to heparin in case she needs procedure Troponins and BNP negative Echo, LE [place]  Bilateral Pleural Effusions  Small to Moderate Ascites Follow echo BNP is wnl UA pending to eval for proteinuria, HFP pending Will diurese as tolerated Will consider thora (consider para) (will decide based on response to lasix)  Enlarged uterus due to Liliana Brentlinger large heterogenous hypoattenuating area in the fundus/uterine body region Benign vs malignant mass Concerning given above for malignancy given VTE and pleural effusions and ascites this mass was looked into in 2022 and she was told it was benign Care everywhere note from 09/10/2020 notes endometrium biopsy with benign atrophic endometrium, benign endocervical and ectocervical epithelium.  Endocervix curetage with fragments of benign endocervical and ectocervical epithelium. I've asked gyn onc to eval, appreciate their expertise  Soft Tissue stranding in upper abdomen Non specific, due to ascites or due to peritoneal carcinomatosis Working up as noted above  Hyperglycemia Follow A1c  Hypothyroidism  Synthroid Needs repeat labs Rasheema Tr[place] few weeks after hospitalization   Obesity Body mass index is 35.87 kg/m.     DVT prophylaxis: Heparin gtt  Code Status:   full  Family Communication:  Sig other at bedside  Disposition Plan:   Patient is from:  home  Anticipated DC to:  home  Anticipated DC date:  Pending   Anticipated DC barriers: pending  Consults called:  Gyn onc  Admission status:  inpatient   Severity of Illness: The appropriate  patient status for this patient is INPATIENT. Inpatient status is judged to be reasonable and necessary in order to provide the required intensity of service to ensure the patient's safety. The patient's presenting symptoms, physical exam findings, and initial radiographic and laboratory data in the context of their chronic comorbidities is felt to place them at high risk for further clinical deterioration. Furthermore, it is not anticipated that the patient will be medically stable for discharge from the hospital within 2 midnights of admission.   * I certify that at the point of admission it is my clinical judgment that the patient will require inpatient hospital care spanning beyond 2 midnights from the point of admission due to high intensity of service, high risk for further deterioration and high frequency of surveillance required.Lacretia Nicks MD [facility]  How to contact the [facility] Attending or Consulting provider 7A - 7P or covering provider during after hours 7P -7A, for this patient?   Check the care team in [facility] and look for Kriya Westra) attending/consulting [facility] provider listed and b) the [facility] team listed Log into [email or web address] and use Harvey's universal password to access. If you do not have the password, please contact the hospital operator. Locate the [facility] provider you are looking for under [facility] and page to Emmy Keng number that you can be directly reached. If you still have difficulty reaching the provider, please page the [facility] (Director on Call) for the Hospitalists listed on amion for assistance.  05/25/2023, 8:36 PM

## 2023-05-25 NOTE — ED Provider Triage Note (Signed)
 Emergency Medicine Provider Triage Evaluation Note  Sharon Hensley , a 58 y.o. female  was evaluated in triage.  Pt complains of 2 weeks of worsening shortness of breath on exertion, orthopnea.  States she has had chest pain worse with walking, says her chest feels "heavy."  Seen 1 week ago by PCP.   Reports bilateral leg swelling, cough Denies fever, chills, congestion.   Review of Systems  Positive: N/a Negative: N/a  Physical Exam  BP (!) 133/93   Pulse (!) 109   Temp 98.1 F (36.7 C) (Oral)   Resp (!) 24   Ht 5\' 4"  (1.626 m)   Wt 94.8 kg   SpO2 91%   BMI 35.87 kg/m  Gen:   Awake, no distress   Resp:  Normal effort  MSK:   Moves extremities without difficulty  Other:    Medical Decision Making  Medically screening exam initiated at 10:48 AM.  Appropriate orders placed.  Sharon Hensley was informed that the remainder of the evaluation will be completed by another provider, this initial triage assessment does not replace that evaluation, and the importance of remaining in the ED until their evaluation is complete.     Sharon Hensley, [place] 05/25/23 1052

## 2023-05-25 NOTE — Progress Notes (Addendum)
 PHARMACY - ANTICOAGULATION CONSULT NOTE  Pharmacy Consult for IV Heparin Indication: pulmonary embolus  Allergies  Allergen Reactions   Clindamycin/Lincomycin Anaphylaxis   Penicillins Hives and Itching   Veetids [Penicillin V] Hives and Itching    Patient Measurements: Height: 5\' 4"  (162.6 cm) Weight: 94.8 kg (209 lb) IBW/kg (Calculated) : 54.7 Heparin dosing weight: 76 kg  Vital Signs: Temp: 98.7 F (37.1 C) (02/24 1415) Temp Source: Oral (02/24 1415) BP: 161/82 (02/24 1600) Pulse Rate: 95 (02/24 1600)  Labs: Recent Labs    05/25/23 1107 05/25/23 1341  HGB 15.5*  --   HCT 49.1*  --   PLT 270  --   CREATININE 0.63  --   TROPONINIHS 2 2    Estimated Creatinine Clearance: 86.6 mL/min (by C-G formula based on SCr of 0.63 mg/dL).   Medical History: Past Medical History:  Diagnosis Date   Anemia    Goiter    Temporary low platelet count (HCC)     Assessment: Active Problem(s): SOB, CP  AC/Heme: New BL PE with Ddimer 11.97, Hgb 15.5, Plts 270 at baseline 2/24 CT: Bilateral segmental pulmonary emboli. Mild moderate clot burden with no evidence of right heart strain.  Goal of Therapy:  Anti-Xa level 0.6-1 units/ml 4hrs after LMWH dose given Monitor platelets by anticoagulation protocol: Yes   Plan:  Lovenox 100mg  SQ x 1 then change to IV heparin. May need thoracentesis or uterine bx. 2/25 at 0500, start IV heparin infusion at 1300 units/hr Will check heparin level 6-8 hrs later Daily HL and CBC   Naithan Delage S. Merilynn Finland, PharmD, BCPS Clinical Staff Pharmacist Misty Stanley Stillinger 05/25/2023,4:38 PM

## 2023-05-26 ENCOUNTER — Inpatient Hospital Stay (HOSPITAL_COMMUNITY): Payer: BLUE CROSS/BLUE SHIELD

## 2023-05-26 DIAGNOSIS — J90 Pleural effusion, not elsewhere classified: Secondary | ICD-10-CM

## 2023-05-26 DIAGNOSIS — R188 Other ascites: Secondary | ICD-10-CM

## 2023-05-26 DIAGNOSIS — I2699 Other pulmonary embolism without acute cor pulmonale: Secondary | ICD-10-CM | POA: Diagnosis not present

## 2023-05-26 DIAGNOSIS — R609 Edema, unspecified: Secondary | ICD-10-CM | POA: Diagnosis not present

## 2023-05-26 DIAGNOSIS — I2609 Other pulmonary embolism with acute cor pulmonale: Secondary | ICD-10-CM | POA: Diagnosis not present

## 2023-05-26 DIAGNOSIS — R590 Localized enlarged lymph nodes: Secondary | ICD-10-CM

## 2023-05-26 DIAGNOSIS — D390 Neoplasm of uncertain behavior of uterus: Secondary | ICD-10-CM

## 2023-05-26 LAB — URINALYSIS, ROUTINE W REFLEX MICROSCOPIC
Bilirubin Urine: NEGATIVE
Glucose, UA: NEGATIVE mg/dL
Hgb urine dipstick: NEGATIVE
Ketones, ur: NEGATIVE mg/dL
Leukocytes,Ua: NEGATIVE
Nitrite: NEGATIVE
Protein, ur: NEGATIVE mg/dL
Specific Gravity, Urine: >1.046 — ABNORMAL HIGH (ref 1.005–1.030)
pH: 5 (ref 5.0–8.0)

## 2023-05-26 LAB — COMPREHENSIVE METABOLIC PANEL
ALT: 27 U/L (ref 0–44)
AST: 19 U/L (ref 15–41)
Albumin: 3.6 g/dL (ref 3.5–5.0)
Alkaline Phosphatase: 102 U/L (ref 38–126)
Anion gap: 11 (ref 5–15)
BUN: 9 mg/dL (ref 6–20)
CO2: 28 mmol/L (ref 22–32)
Calcium: 10.3 mg/dL (ref 8.9–10.3)
Chloride: 104 mmol/L (ref 98–111)
Creatinine, Ser: 0.68 mg/dL (ref 0.44–1.00)
GFR, Estimated: >60 mL/min (ref >60)
Glucose, Bld: 152 mg/dL — ABNORMAL HIGH (ref 70–99)
Potassium: 3.7 mmol/L (ref 3.5–5.1)
Sodium: 143 mmol/L (ref 135–145)
Total Bilirubin: 0.5 mg/dL (ref 0.0–1.2)
Total Protein: 6.8 g/dL (ref 6.5–8.1)

## 2023-05-26 LAB — ECHOCARDIOGRAM COMPLETE
Area-P 1/2: 6.54 cm2
Height: 64 in
S' Lateral: 2.3 cm
Weight: 3344 [oz_av]

## 2023-05-26 LAB — CBC
HCT: 44 % (ref 36.0–46.0)
Hemoglobin: 14.1 g/dL (ref 12.0–15.0)
MCH: 29.3 pg (ref 26.0–34.0)
MCHC: 32 g/dL (ref 30.0–36.0)
MCV: 91.3 fL (ref 80.0–100.0)
Platelets: 272 10*3/uL (ref 150–400)
RBC: 4.82 MIL/uL (ref 3.87–5.11)
RDW: 13.2 % (ref 11.5–15.5)
WBC: 8.3 10*3/uL (ref 4.0–10.5)
nRBC: 0 % (ref 0.0–0.2)

## 2023-05-26 LAB — HEPARIN LEVEL (UNFRACTIONATED)
Heparin Unfractionated: 0.27 [IU]/mL — ABNORMAL LOW (ref 0.30–0.70)
Heparin Unfractionated: 0.3 [IU]/mL (ref 0.30–0.70)

## 2023-05-26 LAB — HIV ANTIBODY (ROUTINE TESTING W REFLEX): HIV Screen 4th Generation wRfx: NONREACTIVE

## 2023-05-26 MED ORDER — ACETAMINOPHEN 325 MG PO TABS
650.0000 mg | ORAL_TABLET | Freq: Four times a day (QID) | ORAL | Status: DC | PRN
  Administered 2023-05-29 – 2023-05-30 (×4): 650 mg via ORAL
  Filled 2023-05-26 (×5): qty 2

## 2023-05-26 MED ORDER — ACETAMINOPHEN 650 MG RE SUPP: 650.0000 mg | Freq: Four times a day (QID) | RECTAL | Status: DC | PRN

## 2023-05-26 MED ORDER — OXYCODONE HCL 5 MG PO TABS
5.0000 mg | ORAL_TABLET | ORAL | Status: DC | PRN
  Filled 2023-05-26: qty 1

## 2023-05-26 MED ORDER — MORPHINE SULFATE (PF) 2 MG/ML IV SOLN
2.0000 mg | INTRAVENOUS | Status: DC | PRN
  Administered 2023-05-28: 2 mg via INTRAVENOUS
  Filled 2023-05-26: qty 1

## 2023-05-26 MED ORDER — POTASSIUM CHLORIDE CRYS ER 20 MEQ PO TBCR
40.0000 meq | EXTENDED_RELEASE_TABLET | Freq: Once | ORAL | Status: AC
  Administered 2023-05-26: 40 meq via ORAL
  Filled 2023-05-26: qty 2

## 2023-05-26 MED ORDER — LEVOTHYROXINE SODIUM 75 MCG PO TABS
150.0000 ug | ORAL_TABLET | Freq: Every day | ORAL | Status: DC
  Administered 2023-05-26 – 2023-05-31 (×6): 150 ug via ORAL
  Filled 2023-05-26 (×4): qty 2
  Filled 2023-05-26: qty 1
  Filled 2023-05-26: qty 2

## 2023-05-26 MED ORDER — POLYETHYLENE GLYCOL 3350 17 G PO PACK
17.0000 g | PACK | Freq: Two times a day (BID) | ORAL | Status: DC
Start: 2023-05-26 — End: 2023-05-31
  Administered 2023-05-29 – 2023-05-30 (×3): 17 g via ORAL
  Filled 2023-05-26 (×8): qty 1

## 2023-05-26 NOTE — Progress Notes (Signed)
  Echocardiogram 2D Echocardiogram has been performed.  Leda Roys RDCS 05/26/2023, 12:48 PM

## 2023-05-26 NOTE — Progress Notes (Signed)
 PHARMACY - ANTICOAGULATION CONSULT NOTE  Pharmacy Consult for IV Heparin Indication: pulmonary embolus  Allergies  Allergen Reactions   Clindamycin/Lincomycin Anaphylaxis   Hydrochlorothiazide Other (See Comments)    "Made me feel like cement"- patient does not wish to take this again   Penicillins Hives, Itching and Other (See Comments)    Patient said she can tolerate Amoxicillin   Tape Rash    Patient Measurements: Height: 5\' 4"  (162.6 cm) Weight: 94.8 kg (209 lb) IBW/kg (Calculated) : 54.7 Heparin dosing weight: 76 kg  Vital Signs: Temp: 98.6 F (37 C) (02/25 1815) Temp Source: Oral (02/25 1815) BP: 127/80 (02/25 1815) Pulse Rate: 90 (02/25 1815)  Labs: Recent Labs    05/25/23 1107 05/25/23 1341 05/26/23 0525 05/26/23 1322 05/26/23 2037  HGB 15.5*  --  14.1  --   --   HCT 49.1*  --  44.0  --   --   PLT 270  --  272  --   --   HEPARINUNFRC  --   --   --  0.27* 0.30  CREATININE 0.63  --  0.68  --   --   TROPONINIHS 2 2  --   --   --     Estimated Creatinine Clearance: 86.6 mL/min (by C-G formula based on SCr of 0.68 mg/dL).   Medical History: Past Medical History:  Diagnosis Date   Anemia    Goiter    Temporary low platelet count (HCC)     Assessment: Active Problem(s): SOB, CP  AC/Heme: New BL PE with Ddimer 11.97, Hgb 15.5, Plts 270 at baseline 2/24 CT: Bilateral segmental pulmonary emboli. Mild moderate clot burden with no evidence of right heart strain. 2/25 Lower extremity ultrasound + bilateral DVT  Heparin level 0.30 - therapeutic (low end) on heparin infusion at 1500 units/hr CBC stable No complications of therapy noted  Goal of Therapy:  Heparin level 0.3-0.7 Monitor platelets by anticoagulation protocol: Yes   Plan:  Increase heparin infusion to 1550 units/hr Recheck heparin level in 6 hours to confirm Daily HL and CBC   Pricilla Riffle, PharmD, BCPS Clinical Pharmacist 05/26/2023 9:07 PM

## 2023-05-26 NOTE — Progress Notes (Signed)
 Bilateral lower extremity venous duplex has been completed. Preliminary results can be found in CV Proc through chart review.  Results were given to Dr. Lowell Guitar.  05/26/23 10:05 AM Olen Cordial RVT

## 2023-05-26 NOTE — Progress Notes (Signed)
 Pt went for MRI with this RN but pt was unable to complete it d/t  SOB. The MRI technician stated that only 6-8 min remained to be completed (contrast part), but pt couldn't tolerate lying on flat. SOB improved with high Fowler's position. Pt is still on 4L O2.

## 2023-05-26 NOTE — ED Notes (Signed)
 Pt transitioned to humidified [place] from regular [place] to promote comfort.

## 2023-05-26 NOTE — Progress Notes (Incomplete)
 Pt went for MRI with this RN but pt couldn't complete it because of SOB. MRI technician said that pt had only 6-8 min left for contrast part, but pt refused it. Pt stated SOB

## 2023-05-26 NOTE — Progress Notes (Addendum)
 PROGRESS NOTE    Sharon Hensley  MVH:[id number] DOB: 27-Sep-1965 DOA: 05/25/2023 PCP: Gwenyth Bender, FNP  Chief Complaint  Patient presents with   Shortness of Breath    Brief Narrative:   Sharon Hensley is Sharon Hensley 58 y.o. female with medical history significant of hypothyroidism presenting with Sharon Hensley few months of gradually progressive symptoms including shortness of breath.   She was found to have bilateral PE as well as signs of overload including pleural effusions and ascites.  Workup notable for possible uterine mass, previously worked up and thought to be benign.  Due to her constellation of symptoms, gyn onc has been asked to review.   Assessment & Plan:   Principal Problem:   Bilateral pulmonary embolism (HCC) Active Problems:   Shortness of breath  Acute hypoxic respiratory failure Requiring 6 L [place] this AM Multifactorial related to PE, bilateral effusions, suspected heart failure As below   Bilateral segmental Pulmonary Emboli No obvious provoking factor to this point (ongoing eval - see bleow) S/p lovenox in ED -> will transition to heparin in case she needs procedure Troponins and BNP negative Echo, LE [place]   Bilateral Pleural Effusions  Small to Moderate Ascites Volume Overload  Heart Failure Exacerbation Suspect volume overload due to Sharon Hensley heart failure exacerbation, though other causes should be considered for her bilateral pleural effusions and ascites Follow echo BNP is wnl UA without protein, HFP with normal LFT's (normal albumin) diurese as tolerated -> seems she's benefiting from this.   Will consider thora with cytology (+/- para) Strict I/O, daily weights Will plan to continue diuresis for next few days as tolerated.  Repeat CXR ordered 2/27 (or sooner per oncoming hospitalist).  If effusions improving with diuresis, would diurese to resolution.  If not tolerating diuresis or persistent effusions, would order thora with cytology and other studies.   Enlarged uterus due  to Sharon Hensley large heterogenous hypoattenuating area in the fundus/uterine body region Benign vs malignant mass Concerning given above for malignancy given VTE and pleural effusions and ascites this mass was looked into in 2022 and she was told it was benign Care everywhere note from 09/10/2020 notes endometrium biopsy with benign atrophic endometrium, benign endocervical and ectocervical epithelium.  Endocervix curetage with fragments of benign endocervical and ectocervical epithelium. I've asked gyn onc to eval, appreciate their expertise   Soft Tissue stranding in upper abdomen Non specific, due to ascites or due to peritoneal carcinomatosis Working up as noted above   Hyperglycemia Follow A1c (pending)   Hypothyroidism Synthroid Needs repeat TSH Sharon Hensley few weeks after hospitalization   Obesity Body mass index is 35.87 kg/m.    DVT prophylaxis: heparin gtt Code Status: full Family Communication: none at bedside, partner Sharon Hensley 2/24 Disposition:   Status is: Inpatient Remains inpatient appropriate because: continued need for ongoing workup, inpatient care   Consultants:  Gyn onc  Procedures:  none  Antimicrobials:  Anti-infectives (From admission, onward)    None       Subjective: No new complaints Feels Sharon Hensley little better today from breathing standpoint, thinks lasix may have helped  Objective: Vitals:   05/26/23 0300 05/26/23 0400 05/26/23 0552 05/26/23 0630  BP: 124/72 126/73  122/70  Pulse: 95 85  81  Resp: (!) 22 19  16   Temp:   98.5 F (36.9 C)   TempSrc:   Oral   SpO2: 93% 92%  94%  Weight:      Height:       No intake or output  data in the 24 hours ending 05/26/23 0821 Filed Weights   05/25/23 1039  Weight: 94.8 kg    Examination:  General exam: Appears calm, comfortable - sitting on edge of bed - tearful at times with stress Respiratory system: diminished, unlabored Cardiovascular system: RRR Gastrointestinal system: Abdomen is distended, soft,  nontender Central nervous system: Alert and oriented. No focal neurological deficits. Hard of hearing. Extremities: trace edema   Data Reviewed: I have personally reviewed following labs and imaging studies  CBC: Recent Labs  Lab 05/25/23 1107 05/26/23 0525  WBC 8.7 8.3  HGB 15.5* 14.1  HCT 49.1* 44.0  MCV 91.8 91.3  PLT 270 272    Basic Metabolic Panel: Recent Labs  Lab 05/25/23 1107 05/26/23 0525  NA 140 143  K 3.7 3.7  CL 104 104  CO2 25 28  GLUCOSE 175* 152*  BUN 8 9  CREATININE 0.63 0.68  CALCIUM 10.2 10.3    GFR: Estimated Creatinine Clearance: 86.6 mL/min (by C-G formula based on SCr of 0.68 mg/dL).  Liver Function Tests: Recent Labs  Lab 05/25/23 1341 05/26/23 0525  AST 27 19  ALT 29 27  ALKPHOS 105 102  BILITOT 0.6 0.5  PROT 7.3 6.8  ALBUMIN 3.9 3.6    CBG: No results for input(s): "GLUCAP" in the last 168 hours.   Recent Results (from the past 240 hours)  Resp panel by RT-PCR (RSV, Flu Sharon Hensley&B, Covid) Anterior Nasal Swab     Status: None   Collection Time: 05/25/23 12:12 PM   Specimen: Anterior Nasal Swab  Result Value Ref Range Status   SARS Coronavirus 2 by RT PCR NEGATIVE NEGATIVE Final    Comment: (NOTE) SARS-CoV-2 target nucleic acids are NOT DETECTED.  The SARS-CoV-2 RNA is generally detectable in upper respiratory specimens during the acute phase of infection. The lowest concentration of SARS-CoV-2 viral copies this assay can detect is 138 copies/mL. Sharon Hensley negative result does not preclude SARS-Cov-2 infection and should not be used as the sole basis for treatment or other patient management decisions. Sharon Hensley negative result may occur with  improper specimen collection/handling, submission of specimen other than nasopharyngeal swab, presence of viral mutation(s) within the areas targeted by this assay, and inadequate number of viral copies(<138 copies/mL). Sharon Hensley negative result must be combined with clinical observations, patient history, and  epidemiological information. The expected result is Negative.  Fact Sheet for Patients:  BloggerCourse.com  Fact Sheet for Healthcare Providers:  SeriousBroker.it  This test is no t yet approved or cleared by the [place] FDA and  has been authorized for detection and/or diagnosis of SARS-CoV-2 by FDA under an Emergency Use Authorization (EUA). This EUA will remain  in effect (meaning this test can be used) for the duration of the COVID-19 declaration under Section 564(b)(1) of the Act, 21 U.S.C.section 360bbb-3(b)(1), unless the authorization is terminated  or revoked sooner.       Influenza Sharon Hensley by PCR NEGATIVE NEGATIVE Final   Influenza B by PCR NEGATIVE NEGATIVE Final    Comment: (NOTE) The Xpert Xpress SARS-CoV-2/FLU/RSV plus assay is intended as an aid in the diagnosis of influenza from Nasopharyngeal swab specimens and should not be used as Sharon Knotek sole basis for treatment. Nasal washings and aspirates are unacceptable for Xpert Xpress SARS-CoV-2/FLU/RSV testing.  Fact Sheet for Patients: BloggerCourse.com  Fact Sheet for Healthcare Providers: SeriousBroker.it  This test is not yet approved or cleared by the [place] FDA and has been authorized for detection and/or diagnosis of SARS-CoV-2  by FDA under an Emergency Use Authorization (EUA). This EUA will remain in effect (meaning this test can be used) for the duration of the COVID-19 declaration under Section 564(b)(1) of the Act, 21 U.S.C. section 360bbb-3(b)(1), unless the authorization is terminated or revoked.     Resp Syncytial Virus by PCR NEGATIVE NEGATIVE Final    Comment: (NOTE) Fact Sheet for Patients: BloggerCourse.com  Fact Sheet for Healthcare Providers: SeriousBroker.it  This test is not yet approved or cleared by the [place] FDA and has been  authorized for detection and/or diagnosis of SARS-CoV-2 by FDA under an Emergency Use Authorization (EUA). This EUA will remain in effect (meaning this test can be used) for the duration of the COVID-19 declaration under Section 564(b)(1) of the Act, 21 U.S.C. section 360bbb-3(b)(1), unless the authorization is terminated or revoked.  Performed at [facility], [address] [place], [place] [address]          Radiology Studies: CT Angio Chest PE W/Cm &/Or Wo Cm Result Date: 05/25/2023 CLINICAL DATA:  Intermittent shortness of breath for 2 weeks, left-sided chest pain EXAM: CT ANGIOGRAPHY CHEST WITH CONTRAST TECHNIQUE: Multidetector CT imaging of the chest was performed using the standard protocol during bolus administration of intravenous contrast. Multiplanar CT image reconstructions and MIPs were obtained to evaluate the vascular anatomy. RADIATION DOSE REDUCTION: This exam was performed according to the departmental dose-optimization program which includes automated exposure control, adjustment of the mA and/or kV according to patient size and/or use of iterative reconstruction technique. CONTRAST:  OMNIPAQUE IOHEXOL 350 MG/ML SOLN COMPARISON:  05/25/2023 FINDINGS: Cardiovascular: This is Forever Arechiga technically adequate evaluation of the pulmonary vasculature. There are bilateral pulmonary emboli, primarily within the segmental vessels of the left upper, right upper, and right lower lobes. Mild to moderate clot burden. No evidence of right heart strain. The heart is unremarkable without pericardial effusion. No evidence of thoracic aortic aneurysm or dissection. Atherosclerosis of the aorta and coronary vasculature. Mediastinum/Nodes: Nonspecific subcentimeter lymph nodes throughout the mediastinum and hilar regions. Thyroid, trachea, and esophagus are grossly unremarkable. Lungs/Pleura: Moderate bilateral pleural effusions, right greater than left, with significant  compressive atelectasis within the bilateral lower lobes. There is also complete consolidation with volume loss in the right middle lobe compatible with atelectasis. This is of uncertain chronicity. No evidence of central obstructing mass. No airspace disease or pneumothorax.  Central airways are patent. Upper Abdomen: Small volume ascites.  Multiple hepatic cysts. Musculoskeletal: No acute or destructive bony abnormalities. Reconstructed images demonstrate no additional findings. Review of the MIP images confirms the above findings. IMPRESSION: 1. Bilateral segmental pulmonary emboli. Mild moderate clot burden with no evidence of right heart strain. 2. Moderate bilateral pleural effusions, with extensive compressive atelectasis throughout the lower lobes. 3. Complete atelectasis of the right middle lobe, of uncertain chronicity. No central obstructing lesion identified. 4. Numerous subcentimeter mediastinal and hilar lymph nodes, nonspecific. No pathologic adenopathy. 5. Upper abdominal ascites. 6. Aortic Atherosclerosis (ICD10-I70.0). Coronary artery atherosclerosis. Critical Value/emergent results were called by telephone at the time of interpretation on 05/25/2023 at 3:46 pm to provider [facility] , who verbally acknowledged these results. Electronically Signed   By: Sharon Salina M.D.   On: 05/25/2023 15:54   CT ABDOMEN PELVIS W CONTRAST Result Date: 05/25/2023 CLINICAL DATA:  Abdominal pain, acute, nonlocalized. EXAM: CT ABDOMEN AND PELVIS WITH CONTRAST TECHNIQUE: Multidetector CT imaging of the abdomen and pelvis was performed using the standard protocol following bolus administration of intravenous contrast. RADIATION  DOSE REDUCTION: This exam was performed according to the departmental dose-optimization program which includes automated exposure control, adjustment of the mA and/or kV according to patient size and/or use of iterative reconstruction technique. CONTRAST:  OMNIPAQUE IOHEXOL 350 MG/ML  SOLN COMPARISON:  CT scan abdomen and pelvis from 04/01/2003. FINDINGS: Lower chest: Bilateral moderate to large pleural effusions noted with associated compressive atelectatic changes. The heart is normal in size. No pericardial effusion. Hepatobiliary: The liver is normal in size. Non-cirrhotic configuration. There are at least 2 simple cysts in the liver with largest measuring up to 3.3 x 3.4 cm. The cysts are present since the prior study dating back to 2005 and exhibits significant interval growth. No intrahepatic or extrahepatic bile duct dilation. Gallbladder is surgically absent. Pancreas: Unremarkable. No pancreatic ductal dilatation or surrounding inflammatory changes. Spleen: Within normal limits. No focal lesion. Adrenals/Urinary Tract: Adrenal glands are unremarkable. No suspicious renal mass. No hydronephrosis. No renal or ureteric calculi. Urinary bladder is under distended, precluding optimal assessment. However, no large mass or stones identified. No perivesical fat stranding. Stomach/Bowel: No disproportionate dilation of the small or large bowel loops. No evidence of abnormal bowel wall thickening or inflammatory changes. The appendix is unremarkable. Vascular/Lymphatic: There is small-to-moderate ascites mainly in the perihepatic/perisplenic region, dependent pelvis and bilateral paracolic gutters. There are soft tissue nodularity/stranding in the upper abdomen, which is nonspecific but can be seen with ascites or due to peritoneal carcinomatosis. No pneumoperitoneum. There are mildly enlarged retroperitoneal lymph nodes, which are indeterminate in etiology. No aneurysmal dilation of the major abdominal arteries. There are mild peripheral atherosclerotic vascular calcifications of the aorta and its major branches. Reproductive: The uterus is enlarged secondary to Jumana Paccione markedly enlarged 12.9 x 13.7 cm heterogeneous hypoattenuating area in the fundus/uterine body region. The uterus is probably pushed  to the right and inferiorly however, not well evaluated. Endometrium is not seen. Bilateral ovaries are not distinctly visualized on this exam however, no large adnexal mass seen. Other: There is Angelie Kram small fat containing umbilical hernia. The soft tissues and abdominal wall are otherwise unremarkable. Musculoskeletal: No suspicious osseous lesions. There are mild multilevel degenerative changes in the visualized spine. IMPRESSION: 1. Enlarged uterus secondary to Shivangi Lutz large heterogeneous hypoattenuating area in the fundus/uterine body region. This is not well evaluated on the current exam. Differential diagnosis includes benign versus malignant uterine mass. Correlate clinically. Consider additional imaging as indicated. 2. There is small-to-moderate ascites. There is also soft tissue nodularity/stranding in the upper abdomen, which is nonspecific but can be seen with ascites or due to peritoneal carcinomatosis. 3. Bilateral moderate to large pleural effusions with associated compressive atelectatic changes. 4. Multiple other nonacute observations, as described above. Aortic Atherosclerosis (ICD10-I70.0). Electronically Signed   By: Sharon Schick M.D.   On: 05/25/2023 15:44   DG Chest Port 1 View Result Date: 05/25/2023 CLINICAL DATA:  Shortness of breath. EXAM: PORTABLE CHEST 1 VIEW COMPARISON:  07/20/2013. FINDINGS: Low lung volume. Mild diffuse pulmonary vascular congestion, likely accentuated by low lung volume. No frank pulmonary edema. There are bilateral small-to-moderate pleural effusions with probable underlying compressive atelectatic changes. No pneumothorax. Normal cardio-mediastinal silhouette. No acute osseous abnormalities. The soft tissues are within normal limits. IMPRESSION: Findings concerning for congestive heart failure/pulmonary edema. Correlate clinically. Electronically Signed   By: Sharon Schick M.D.   On: 05/25/2023 12:28        Scheduled Meds:  furosemide  40 mg Intravenous BID    levothyroxine  150 mcg Oral QAC breakfast  polyethylene glycol  17 g Oral BID   Continuous Infusions:  heparin 1,300 Units/hr (05/26/23 0529)     LOS: 1 day    Time spent: over 30 min    Sharon Nicks, Sharon Hensley [facility]   To contact the attending provider between 7A-7P or the covering provider during after hours 7P-7A, please log into the web site [email or web address] and access using universal [place] password for that web site. If you do not have the password, please call the hospital operator.  05/26/2023, 8:21 AM

## 2023-05-26 NOTE — Progress Notes (Signed)
 PHARMACY - ANTICOAGULATION CONSULT NOTE  Pharmacy Consult for IV Heparin Indication: pulmonary embolus  Allergies  Allergen Reactions   Clindamycin/Lincomycin Anaphylaxis   Hydrochlorothiazide Other (See Comments)    "Made me feel like cement"- patient does not wish to take this again   Penicillins Hives, Itching and Other (See Comments)    Patient said she can tolerate Amoxicillin   Tape Rash    Patient Measurements: Height: 5\' 4"  (162.6 cm) Weight: 94.8 kg (209 lb) IBW/kg (Calculated) : 54.7 Heparin dosing weight: 76 kg  Vital Signs: Temp: 97.6 F (36.4 C) (02/25 1119) Temp Source: Oral (02/25 1119) BP: 139/81 (02/25 1119) Pulse Rate: 101 (02/25 1119)  Labs: Recent Labs    05/25/23 1107 05/25/23 1341 05/26/23 0525 05/26/23 1322  HGB 15.5*  --  14.1  --   HCT 49.1*  --  44.0  --   PLT 270  --  272  --   HEPARINUNFRC  --   --   --  0.27*  CREATININE 0.63  --  0.68  --   TROPONINIHS 2 2  --   --     Estimated Creatinine Clearance: 86.6 mL/min (by C-G formula based on SCr of 0.68 mg/dL).   Medical History: Past Medical History:  Diagnosis Date   Anemia    Goiter    Temporary low platelet count (HCC)     Assessment: Active Problem(s): SOB, CP  AC/Heme: New BL PE with Ddimer 11.97, Hgb 15.5, Plts 270 at baseline 2/24 CT: Bilateral segmental pulmonary emboli. Mild moderate clot burden with no evidence of right heart strain.  Heparin level just below goal on current IV heparin rate of 1300 units/hr CBC stable Per RN, no reported bleeding or line issues  Goal of Therapy:  Anti-Xa level 0.6-1 units/ml 4hrs after LMWH dose given Monitor platelets by anticoagulation protocol: Yes   Plan:  Increase IV heparin from 1300 to 1500 units/hr Recheck heparin level in 6 hours Daily HL and CBC   Hessie Knows, PharmD, BCPS Secure Chat if ?s 05/26/2023 1:54 PM

## 2023-05-26 NOTE — Consult Note (Signed)
 Gynecologic Oncology Consultation  Sharon Hensley 58 y.o. female  CC:  Chief Complaint  Patient presents with   Shortness of Breath    HPI: Sharon Hensley is a 58 year old female who presented to the ER on 05/25/2023 on intermittent shortness of breath x [redacted] weeks along with new worsening left sided chest pain. Labs obtained in ER included CBC, Bmet, troponin at 2, BNP 53.2, TSH 6.108, negative respiratory panel, d dimer 11.97. Chest xray was performed on 05/25/23 with results concerning for congestive heart failure/pulmonary edema. CT angio chest revealing: 1. Bilateral segmental pulmonary emboli. Mild moderate clot burden with no evidence of right heart strain. 2. Moderate bilateral pleural effusions, with extensive compressive atelectasis throughout the lower lobes. 3. Complete atelectasis of the right middle lobe, of uncertain chronicity. No central obstructing lesion identified. 4. Numerous subcentimeter mediastinal and hilar lymph nodes, nonspecific. No pathologic adenopathy. 5. Upper abdominal ascites. 6. Aortic Atherosclerosis (ICD10-I70.0). Coronary artery atherosclerosis.  A CT AP was performed as well returning with: 1. Enlarged uterus secondary to a large heterogeneous hypoattenuating area in the fundus/uterine body region. This is not well evaluated on the current exam. Differential diagnosis includes benign versus malignant uterine mass. Correlate clinically. Consider additional imaging as indicated. 2. There is small-to-moderate ascites. There is also soft tissue nodularity/stranding in the upper abdomen, which is nonspecific but can be seen with ascites or due to peritoneal carcinomatosis. 3. Bilateral moderate to large pleural effusions with associated compressive atelectatic changes. 4. Multiple other nonacute observations, as described above. 5. Aortic Atherosclerosis   She was started on a heparin drip with plans for admission.   In Care Everywhere, it is noted she had work up in  2022 for a uterine mass with endometrium biopsy with benign atrophic endometrium, benign endocervical and ectocervical epithelium. Endocervix curetage with fragments of benign endocervical and ectocervical epithelium. Negative for malignancy or dysplasia with both. She also had a pap around this time that showed endometrial cells per notes. [place] at the office visit with [facility] on 09/05/2020 showed: The uterus 17.6 x 14.7 x 11.8 cm. It was difficult to delineate the endometrial cavity due to the large fibroids. There was 1 fibroid noted that was 14.73 cm. Right left ovary appear to be slightly larger than normal for menopause with the left one 3.4 x 2.8 x 3.56 cm. Right ovary is 4.7 x 3.9 x 3.4 cm. No free fluid is present. She denies a history of abnormal pap smears before that time.   Her past medical history includes hypothyroidism. Past surgical history includes cholecystectomy, mastoidectomy 2021. Family history includes mother with diabetes, maternal half brother with colon cancer, maternal aunt with metastatic cancer (possibly gyn). Patient is HOH and wears hearing aids. Positive for history of tobacco use with no history of COPD or lung disease.  Interval History:  Patient states she stopped having periods at age 78. No bleeding or spotting since that time. Her bowels had changed recently with more constipation for the past 3-4 weeks, maybe little longer per pt. She has had new bloating and early satiety for the past 3-4 weeks as well. Last BM yesterday and has been having constipation. Was having two BMs a day when she was doing well. Never had a colonoscopy. Has had intermittent black stools, as recent as BM yesterday. No obvious bright red blood in stool. Last mammogram several years ago, due this upcoming Wed. No fam hx of breast or ovarian cancer. Maternal half brother with colon cancer. Thinks her  maternal aunt may have had uterine cancer. No pelvic pain.   Review of Systems: See  interval.  Current Meds: Current inpatient and outpatient meds reviewed.  Allergy:  Allergies  Allergen Reactions   Clindamycin/Lincomycin Anaphylaxis   Hydrochlorothiazide Other (See Comments)    "Made me feel like cement"- patient does not wish to take this again   Penicillins Hives, Itching and Other (See Comments)    Patient said she can tolerate Amoxicillin   Tape Rash    Social Hx:   Social History   Socioeconomic History   Marital status: Media planner    Spouse name: Not on file   Number of children: Not on file   Years of education: Not on file   Highest education level: Not on file  Occupational History   Not on file  Tobacco Use   Smoking status: Former    Types: Cigarettes   Smokeless tobacco: Never  Vaping Use   Vaping status: Never Used  Substance and Sexual Activity   Alcohol use: No   Drug use: No   Sexual activity: Yes  Other Topics Concern   Not on file  Social History Narrative   Not on file   Social Drivers of Health   Financial Resource Strain: Not on file  Food Insecurity: Low Risk  (04/02/2023)   Received from [facility]   Hunger Vital Sign    Worried About Running Out of Food in the Last Year: Never true    Ran Out of Food in the Last Year: Never true  Transportation Needs: No Transportation Needs (04/02/2023)   Received from [facility]    In the past 12 months, has lack of reliable transportation kept you from medical appointments, meetings, work or from getting things needed for daily living? : No  Physical Activity: Not on file  Stress: Not on file  Social Connections: Not on file  Intimate Partner Violence: Not on file    Past Surgical Hx:  Past Surgical History:  Procedure Laterality Date   CHOLECYSTECTOMY      Past Medical Hx:  Past Medical History:  Diagnosis Date   Anemia    Goiter    Temporary low platelet count (HCC)     Family Hx:  Family History  Problem Relation Age of Onset    Diabetes Mother    Diabetes Other     Vitals:  Blood pressure 122/70, pulse 81, temperature 98.5 F (36.9 C), temperature source Oral, resp. rate 16, height 5\' 4"  (1.626 m), weight 209 lb (94.8 kg), last menstrual period 10/05/2016, SpO2 94%.  Physical Exam (patient seen at 9:17 am then return visit for pelvic examination with endometrial biopsy performed by Dr. Alvester Morin with myself as chaperone at 14:07 pm):  Alert, oriented, in no acute distress Breathing is unlabored at this time, currently on 5L O2 See addition from Dr. Alvester Morin with pelvic examination findings and endometrial biopsy procedure  Assessment/Plan:  58 year old female currently being admitted with bilateral PEs found on imaging along with ascites, pleural effusions, uterine mass. The area in the uterus could be a fibroid with possibility this could be unrelated to current situation. Size is similar compared to [place] in 2022. Endometrial biopsy obtained today. If the uterine mass is a fibroid, endometrial biopsy may be undiagnostic. Recommend CA 125, CEA, CA 19-9 (orders placed). MRI of the pelvis ordered per Dr. Alvester Morin. If thoracentesis is performed, please send for cytology. Continue current plan of care. GYN ONC  will follow up on results.     Doylene Bode, NP 05/26/2023, 8:18 AM

## 2023-05-27 DIAGNOSIS — I2699 Other pulmonary embolism without acute cor pulmonale: Secondary | ICD-10-CM | POA: Diagnosis not present

## 2023-05-27 LAB — CBC
HCT: 45.5 % (ref 36.0–46.0)
Hemoglobin: 14.1 g/dL (ref 12.0–15.0)
MCH: 29.4 pg (ref 26.0–34.0)
MCHC: 31 g/dL (ref 30.0–36.0)
MCV: 95 fL (ref 80.0–100.0)
Platelets: 270 10*3/uL (ref 150–400)
RBC: 4.79 MIL/uL (ref 3.87–5.11)
RDW: 13.2 % (ref 11.5–15.5)
WBC: 8.8 10*3/uL (ref 4.0–10.5)
nRBC: 0 % (ref 0.0–0.2)

## 2023-05-27 LAB — HEMOGLOBIN A1C
Hgb A1c MFr Bld: 6.8 % — ABNORMAL HIGH (ref 4.8–5.6)
Mean Plasma Glucose: 148 mg/dL

## 2023-05-27 LAB — BASIC METABOLIC PANEL
Anion gap: 12 (ref 5–15)
BUN: 11 mg/dL (ref 6–20)
CO2: 27 mmol/L (ref 22–32)
Calcium: 9.9 mg/dL (ref 8.9–10.3)
Chloride: 100 mmol/L (ref 98–111)
Creatinine, Ser: 0.65 mg/dL (ref 0.44–1.00)
GFR, Estimated: >60 mL/min (ref >60)
Glucose, Bld: 135 mg/dL — ABNORMAL HIGH (ref 70–99)
Potassium: 3.7 mmol/L (ref 3.5–5.1)
Sodium: 139 mmol/L (ref 135–145)

## 2023-05-27 LAB — CEA: CEA: 0.9 ng/mL (ref 0.0–4.7)

## 2023-05-27 LAB — CANCER ANTIGEN 19-9: CA 19-9: 73 U/mL — ABNORMAL HIGH (ref 0–35)

## 2023-05-27 LAB — CA 125: Cancer Antigen (CA) 125: 1092 U/mL — ABNORMAL HIGH (ref 0.0–38.1)

## 2023-05-27 LAB — PHOSPHORUS: Phosphorus: 3.1 mg/dL (ref 2.5–4.6)

## 2023-05-27 LAB — HEPARIN LEVEL (UNFRACTIONATED)
Heparin Unfractionated: 0.25 [IU]/mL — ABNORMAL LOW (ref 0.30–0.70)
Heparin Unfractionated: 0.36 [IU]/mL (ref 0.30–0.70)
Heparin Unfractionated: 0.29 [IU]/mL — ABNORMAL LOW (ref 0.30–0.70)

## 2023-05-27 LAB — MAGNESIUM: Magnesium: 2.2 mg/dL (ref 1.7–2.4)

## 2023-05-27 NOTE — Progress Notes (Signed)
 PHARMACY - ANTICOAGULATION CONSULT NOTE  Pharmacy Consult for IV Heparin Indication: pulmonary embolus  Allergies  Allergen Reactions   Clindamycin/Lincomycin Anaphylaxis   Hydrochlorothiazide Other (See Comments)    "Made me feel like cement"- patient does not wish to take this again   Penicillins Hives, Itching and Other (See Comments)    Patient said she can tolerate Amoxicillin   Tape Rash    Patient Measurements: Height: 5\' 4"  (162.6 cm) Weight: 91.9 kg (202 lb 9.6 oz) IBW/kg (Calculated) : 54.7 Heparin dosing weight: 76 kg  Vital Signs: Temp: 98.2 F (36.8 C) (02/26 2058) Temp Source: Oral (02/26 2058) BP: 141/84 (02/26 2058) Pulse Rate: 95 (02/26 2058)  Labs: Recent Labs    05/25/23 1107 05/25/23 1341 05/26/23 0525 05/26/23 1322 05/27/23 0455 05/27/23 1226 05/27/23 2131  HGB 15.5*  --  14.1  --  14.1  --   --   HCT 49.1*  --  44.0  --  45.5  --   --   PLT 270  --  272  --  270  --   --   HEPARINUNFRC  --   --   --    < > 0.25* 0.36 0.29*  CREATININE 0.63  --  0.68  --  0.65  --   --   TROPONINIHS 2 2  --   --   --   --   --    < > = values in this interval not displayed.    Estimated Creatinine Clearance: 85.2 mL/min (by C-G formula based on SCr of 0.65 mg/dL).  Assessment: 73 yoF presents with worsening SOB/chest pain; found to have bilateral segmental PEs (no RH strain noted). Pharmacy consulted to dose IV heparin  Today, 05/27/2023: Heparin level now slightly subtherapeutic with heparin @ 1750 units/hr No s/sx of bleeding or infusion issues noted  Goal of Therapy:  Heparin level 0.3-0.7 Monitor platelets by anticoagulation protocol: Yes   Plan:  Increase heparin infusion rate to 1900 units/hr  Recheck heparin level in 6 hours after rate increase Daily heparin level and CBC Monitor for s/sx of bleeding daily  Terrilee Files, PharmD 05/27/2023, 11:26 PM

## 2023-05-27 NOTE — Progress Notes (Signed)
 PHARMACY - ANTICOAGULATION CONSULT NOTE  Pharmacy Consult for IV Heparin Indication: pulmonary embolus  Allergies  Allergen Reactions   Clindamycin/Lincomycin Anaphylaxis   Hydrochlorothiazide Other (See Comments)    "Made me feel like cement"- patient does not wish to take this again   Penicillins Hives, Itching and Other (See Comments)    Patient said she can tolerate Amoxicillin   Tape Rash    Patient Measurements: Height: 5\' 4"  (162.6 cm) Weight: 91.9 kg (202 lb 9.6 oz) IBW/kg (Calculated) : 54.7 Heparin dosing weight: 76 kg  Vital Signs: Temp: 97.9 F (36.6 C) (02/26 1304) Temp Source: Oral (02/26 1304) BP: 137/83 (02/26 1304) Pulse Rate: 91 (02/26 1304)  Labs: Recent Labs    05/25/23 1107 05/25/23 1341 05/26/23 0525 05/26/23 1322 05/26/23 2037 05/27/23 0455 05/27/23 1226  HGB 15.5*  --  14.1  --   --  14.1  --   HCT 49.1*  --  44.0  --   --  45.5  --   PLT 270  --  272  --   --  270  --   HEPARINUNFRC  --   --   --    < > 0.30 0.25* 0.36  CREATININE 0.63  --  0.68  --   --  0.65  --   TROPONINIHS 2 2  --   --   --   --   --    < > = values in this interval not displayed.    Estimated Creatinine Clearance: 85.2 mL/min (by C-G formula based on SCr of 0.65 mg/dL).  Assessment: 69 yoF presents with worsening SOB/chest pain; found to have bilateral segmental PEs (no RH strain noted). Pharmacy consulted to dose IV heparin  Today, 05/27/2023: Heparin level now therapeutic after rate increase to 1700 units/hr CBC, SCr stable WNL No s/sx of bleeding or infusion issues per RN  Goal of Therapy:  Heparin level 0.3-0.7 Monitor platelets by anticoagulation protocol: Yes   Plan:  Slightly increase heparin infusion rate to 1750 units/hr given borderline low levels Recheck confirmatory heparin level in 6 hours Daily heparin level and CBC Monitor for s/sx of bleeding daily  Bernadene Person, PharmD, BCPS [phone] 05/27/2023, 2:36 PM

## 2023-05-27 NOTE — Progress Notes (Signed)
 GYN Oncology Progress Note  Hx:  58 year old female currently being admitted with bilateral PEs found on imaging along with ascites, pleural effusions, uterine mass. An endometrial biopsy was performed on 05/26/23 with results pending. Patient attempted ordered MRI last pm and was unable to lay flat for longer periods for the scan due to breathing status.  Interval: She reports doing well. She is still having issues with her breathing intermittently. States she is unable to lay flat and has difficulty taking a deep breath. She is tolerating her diet with no nausea or emesis. Up with assist. Voiding without difficulty. Has had light spotting when wiping since the endometrial biopsy from yesterday. Denies heavier bleeding. Had a BM yesterday that was more brown. No needs voiced.  Exam: Alert, oriented, in no acute distress. Lungs diminished in the bases, unlabored, no wheezing/rhonchi. Abdomen is soft with active bowel sounds.  MRI IMPRESSION: -Dominant 13.3 cm intramural fibroid in the left anterior uterine body. While uterine leiomyosarcoma cannot be excluded by imaging, the lesion does not demonstrate suspicious imaging findings by MR.  -Moderate pelvic ascites with suspected peritoneal disease/omental caking beneath the anterior abdominal wall, incompletely visualized. -Mild pelvic lymphadenopathy, suspicious for nodal metastases. -Assuming this does in fact reflect a benign fibroid, the underlying site of primary malignancy is not evident on this study.  Assessment/plan: 58 year old female currently being admitted with bilateral PEs found on imaging along with ascites, pleural effusions, uterine mass. MRI images reviewed by. Dr. Alvester Morin who feels the fibroid appears benign. Awaiting results of endometrial biopsy performed 05/26/2023. Tumor markers obtained yesterday and still in process. If tumor markers are abnormal and endometrial biopsy is negative, can consult IR to see if there is a  possibility of biopsy (ascites, not of uterine mass) or paracentesis per Dr. Alvester Morin. Will continue to follow results. No needs voiced pt patient at this time. Continue with current plan of care.

## 2023-05-27 NOTE — Progress Notes (Signed)
 PHARMACY - ANTICOAGULATION CONSULT NOTE  Pharmacy Consult for IV Heparin Indication: pulmonary embolus  Allergies  Allergen Reactions   Clindamycin/Lincomycin Anaphylaxis   Hydrochlorothiazide Other (See Comments)    "Made me feel like cement"- patient does not wish to take this again   Penicillins Hives, Itching and Other (See Comments)    Patient said she can tolerate Amoxicillin   Tape Rash    Patient Measurements: Height: 5\' 4"  (162.6 cm) Weight: 91.9 kg (202 lb 9.6 oz) IBW/kg (Calculated) : 54.7 Heparin dosing weight: 76 kg  Vital Signs: Temp: 98.1 F (36.7 C) (02/26 0300) Temp Source: Oral (02/26 0300) BP: 134/78 (02/26 0300) Pulse Rate: 82 (02/26 0300)  Labs: Recent Labs    05/25/23 1107 05/25/23 1341 05/26/23 0525 05/26/23 1322 05/26/23 2037 05/27/23 0455  HGB 15.5*  --  14.1  --   --  14.1  HCT 49.1*  --  44.0  --   --  45.5  PLT 270  --  272  --   --  270  HEPARINUNFRC  --   --   --  0.27* 0.30 0.25*  CREATININE 0.63  --  0.68  --   --  0.65  TROPONINIHS 2 2  --   --   --   --     Estimated Creatinine Clearance: 85.2 mL/min (by C-G formula based on SCr of 0.65 mg/dL).   Medical History: Past Medical History:  Diagnosis Date   Anemia    Goiter    Temporary low platelet count (HCC)     Assessment: Active Problem(s): SOB, CP  AC/Heme: New BL PE with Ddimer 11.97, Hgb 15.5, Plts 270 at baseline 2/24 CT: Bilateral segmental pulmonary emboli. Mild moderate clot burden with no evidence of right heart strain. 2/25 Lower extremity ultrasound + bilateral DVT  Heparin level slightly subtherapeutic at 0.25 on heparin infusion @1500  units/hr Hgb 14.1, plts 270--stable No s/sx of bleeding per RN  Goal of Therapy:  Heparin level 0.3-0.7 Monitor platelets by anticoagulation protocol: Yes   Plan:  Increase heparin infusion rate to 1700 units/hr Recheck heparin level in 6 hours Daily heparin level and CBC Monitor for s/sx of bleeding  daily   Cherylin Mylar, PharmD Clinical Pharmacist  2/26/20256:08 AM

## 2023-05-27 NOTE — Progress Notes (Signed)
 PROGRESS NOTE    Sharon Hensley  WUJ:[id number] DOB: 08-20-1965 DOA: 05/25/2023 PCP: Gwenyth Bender, FNP    Brief Narrative:   Sharon Hensley is a 58 y.o. female with past medical history significant for hypothyroidism who presented to [facility] ED on 05/25/2023 with complaints of progressive shortness of breath, left-sided chest pain over the last 2 weeks.  Endorses shortness of breath with both rest and exercise.  She has chronic orthopnea and has been sleeping in the chair for years.  Recently started on hydrochlorothiazide due to lower extremity swelling over the last few weeks, but this was discontinued because she did not like how it made her feel.  Denies fever/chills, no abdominal pain, no nausea/vomiting.  In the ED, temperature 98.1 F, HR 109, RR 24, BP 133/93, SpO2 91% on room air.  WBC 8.7, hemoglobin 15.5, platelet count 270.  Sodium 140, potassium 3.7, chloride 104, CO2 25, glucose 175, BUN 8, creatinine 0.63.  High sensitive troponin 2> 2.  BNP 53.2.  D-dimer 11.97.  Hemoglobin A1c 6.8.  hCG negative.  TSH 6.108, free T41.06.  Influenza A/B, RSV, COVID PCR negative.  Urinalysis unrevealing.  Chest x-ray with bilateral small/moderate pleural effusions with probable underlying compressive atelectatic changes, no pneumothorax.  CT angiogram chest with bilateral segmental pulm emboli, mild/moderate clot burden with no evidence of right heart strain, moderate bilateral pleural effusions with extensive compressive atelectasis throughout the lower lobes, complete atelectasis of the right middle lobe, no central obstructing lesion identified, multiple subcentimeter mediastinal and hilar lymph nodes nonspecific, upper abdominal ascites, aortic atherosclerosis.  CT abdomen/pelvis with contrast enlarged uterus secondary to a large heterogeneous hypoattenuating area in the fundus/uterine body concern for benign versus malignant uterine mass, small/moderate ascites, soft tissue nodularity/stranding  in the upper abdomen, bilateral moderate to large pleural effusions with associated compressive atelectatic changes.  Patient was started on heparin drip. Gyn/onc was consulted given PE, ascites and uterine mass, previously thought to be benign concerning for malignancy.  TRH consulted for admission.    Assessment & Plan:   Acute hypoxic respiratory failure Patient with worsening hypoxia during hospitalization with etiology likely secondary to bilateral segmental pulmonary emboli, and pleural effusions. -- Continue supplemental oxygen, maintain SpO2 greater than 92% -- Continue treatment as below  Pulmonary embolism, bilaterally Patient presenting with progressive shortness of breath, left-sided chest pain.  D-dimer elevated 11.97.  CT angiogram chest with bilateral segmental pulmonary emboli with mild/moderate clot burden with no evidence of right heart strain.  TTE with LVEF 60-65%, LV with no regional wall motion abnormalities, mild concentric LVH, grade 1 diastolic dysfunction, no AR stenosis, IVC normal in size, RV systolic function normal. -- Heparin drip, pharmacy consulted for dosing/monitoring  Acute DVT Vascular duplex ultrasound bilateral lower extremities with findings consistent with acute DVT right femoral vein, right popliteal vein, right posterior tibial veins, right peroneal veins, left popliteal vein, left posterior tibial veins, left peroneal veins. -- Continue heparin drip as above  Bilateral pleural effusions Imaging notable for bilateral pleural effusions with compressive atelectasis. -- Furosemide 40 mg IV every 12 hours -- Repeat chest x-ray in a.m. -- If pleural effusions remain significant, will likely need thoracentesis with cytology  Uterine mass with ascites, pleural effusion, peritoneal disease/omental caking concerning for malignancy Patient presenting with progressive shortness of breath found to have an acute pulmonary embolism also with associated ascites,  pleural effusion on CT imaging.  Pancreas noted to be unremarkable with no pancreatic ductal dilation or surrounding inflammatory changes  on CT abdomen with contrast.  Uterine mass was previously evaluated in 2022 and thought was benign.  MR pelvis without contrast with 13.3 cm intramural fibroid left anterior uterine body, unable to exclude leiomyosarcoma, moderate pelvic ascites with suspected peritoneal disease/omental caking beneath anterior abdominal wall, mild pelvic lymphadenopathy suspicious for nodal metastasis.  Patient underwent endometrial biopsy by GYN/oncology 05/26/2023.  Patient reports family history of colon cancer and renal cell carcinoma, denies any previous colonoscopies but has undergone Cologuard testing that has been reported negative -- GYN/Onc following; appreciate assistance -- CEA 0.9, wNL -- CA 19-9 elevated 73 -- CA 125 elevated 1092.0 -- Pathology from [facility]: Pending  Hypothyroidism TSH 6.108, free T41.06. -- Continue home levothyroxine 150 mcg p.o. daily -- Repeat TFTs in 4-6 weeks  DVT prophylaxis: Heparin drip    Code Status: Full Code Family Communication: Patient's partner who is present at bedside this morning  Disposition Plan:  Level of care: Progressive Status is: Inpatient Remains inpatient appropriate because: IV diuresis, further workup for concern of underlying malignancy    Consultants:  GYN/Onc  Procedures:  Vascular duplex ultrasound bilateral lower extremities TTE  Antimicrobials:  None   Subjective: Patient seen examined bedside, lying in bed.  Patient's partner present at bedside.  No specific complaints this morning, remains on supplemental oxygen, reports shortness of breath has improved.  Discussed findings on imaging studies with patient, concern for underlying malignancy of unknown primary etiology.  Underwent endometrial biopsy yesterday, pathology remains pending.  No other specific questions, concerns or complaints at this  time.  Denies headache, no visual changes, no chest pain, no palpitations, no nausea/vomiting/diarrhea, no fever/chills/night sweats, no abdominal pain, no focal weakness, no fatigue, no paresthesias.  No acute events overnight per nursing staff.  Objective: Vitals:   05/26/23 2246 05/26/23 2300 05/27/23 0300 05/27/23 0500  BP: (!) 143/73 139/83 134/78   Pulse: 91 82 82   Resp: 20 19 18    Temp: 98.4 F (36.9 C) 99.6 F (37.6 C) 98.1 F (36.7 C)   TempSrc:  Oral Oral   SpO2: 100% 99% 96%   Weight:    91.9 kg  Height:        Intake/Output Summary (Last 24 hours) at 05/27/2023 1240 Last data filed at 05/27/2023 1154 Gross per 24 hour  Intake 638.37 ml  Output 1575 ml  Net -936.63 ml   Filed Weights   05/25/23 1039 05/27/23 0500  Weight: 94.8 kg 91.9 kg    Examination:  Physical Exam: GEN: NAD, alert and oriented x 3, obese HEENT: NCAT, PERRL, EOMI, sclera clear, MMM PULM: Breath sounds diminished bilateral bases, no wheezing/crackles, normal respiratory effort without accessory muscle use, on 4 L nasal cannula with SpO2 96% at rest CV: RRR w/o M/G/R GI: abd soft, NTND, NABS, no R/G/M MSK: + Trace lower extremity peripheral edema, muscle strength globally intact 5/5 bilateral upper/lower extremities NEURO: CN II-XII intact, no focal deficits, sensation to light touch intact PSYCH: normal mood/affect Integumentary: dry/intact, no rashes or wounds    Data Reviewed: I have personally reviewed following labs and imaging studies  CBC: Recent Labs  Lab 05/25/23 1107 05/26/23 0525 05/27/23 0455  WBC 8.7 8.3 8.8  HGB 15.5* 14.1 14.1  HCT 49.1* 44.0 45.5  MCV 91.8 91.3 95.0  PLT 270 272 270   Basic Metabolic Panel: Recent Labs  Lab 05/25/23 1107 05/26/23 0525 05/27/23 0455  NA 140 143 139  K 3.7 3.7 3.7  CL 104 104 100  CO2 25  28 27  GLUCOSE 175* 152* 135*  BUN 8 9 11   CREATININE 0.63 0.68 0.65  CALCIUM 10.2 10.3 9.9  MG  --   --  2.2  PHOS  --   --  3.1    GFR: Estimated Creatinine Clearance: 85.2 mL/min (by C-G formula based on SCr of 0.65 mg/dL). Liver Function Tests: Recent Labs  Lab 05/25/23 1341 05/26/23 0525  AST 27 19  ALT 29 27  ALKPHOS 105 102  BILITOT 0.6 0.5  PROT 7.3 6.8  ALBUMIN 3.9 3.6   No results for input(s): "LIPASE", "AMYLASE" in the last 168 hours. No results for input(s): "AMMONIA" in the last 168 hours. Coagulation Profile: No results for input(s): "INR", "PROTIME" in the last 168 hours. Cardiac Enzymes: No results for input(s): "CKTOTAL", "CKMB", "CKMBINDEX", "TROPONINI" in the last 168 hours. BNP (last 3 results) No results for input(s): "PROBNP" in the last 8760 hours. HbA1C: Recent Labs    05/25/23 1107  HGBA1C 6.8*   CBG: No results for input(s): "GLUCAP" in the last 168 hours. Lipid Profile: No results for input(s): "CHOL", "HDL", "LDLCALC", "TRIG", "CHOLHDL", "LDLDIRECT" in the last 72 hours. Thyroid Function Tests: Recent Labs    05/25/23 1107  TSH 6.108*  FREET4 1.06   Anemia Panel: No results for input(s): "VITAMINB12", "FOLATE", "FERRITIN", "TIBC", "IRON", "RETICCTPCT" in the last 72 hours. Sepsis Labs: No results for input(s): "PROCALCITON", "LATICACIDVEN" in the last 168 hours.  Recent Results (from the past 240 hours)  Resp panel by RT-PCR (RSV, Flu A&B, Covid) Anterior Nasal Swab     Status: None   Collection Time: 05/25/23 12:12 PM   Specimen: Anterior Nasal Swab  Result Value Ref Range Status   SARS Coronavirus 2 by RT PCR NEGATIVE NEGATIVE Final    Comment: (NOTE) SARS-CoV-2 target nucleic acids are NOT DETECTED.  The SARS-CoV-2 RNA is generally detectable in upper respiratory specimens during the acute phase of infection. The lowest concentration of SARS-CoV-2 viral copies this assay can detect is 138 copies/mL. A negative result does not preclude SARS-Cov-2 infection and should not be used as the sole basis for treatment or other patient management decisions. A  negative result may occur with  improper specimen collection/handling, submission of specimen other than nasopharyngeal swab, presence of viral mutation(s) within the areas targeted by this assay, and inadequate number of viral copies(<138 copies/mL). A negative result must be combined with clinical observations, patient history, and epidemiological information. The expected result is Negative.  Fact Sheet for Patients:  BloggerCourse.com  Fact Sheet for Healthcare Providers:  SeriousBroker.it  This test is no t yet approved or cleared by the [place] FDA and  has been authorized for detection and/or diagnosis of SARS-CoV-2 by FDA under an Emergency Use Authorization (EUA). This EUA will remain  in effect (meaning this test can be used) for the duration of the COVID-19 declaration under Section 564(b)(1) of the Act, 21 U.S.C.section 360bbb-3(b)(1), unless the authorization is terminated  or revoked sooner.       Influenza A by PCR NEGATIVE NEGATIVE Final   Influenza B by PCR NEGATIVE NEGATIVE Final    Comment: (NOTE) The Xpert Xpress SARS-CoV-2/FLU/RSV plus assay is intended as an aid in the diagnosis of influenza from Nasopharyngeal swab specimens and should not be used as a sole basis for treatment. Nasal washings and aspirates are unacceptable for Xpert Xpress SARS-CoV-2/FLU/RSV testing.  Fact Sheet for Patients: BloggerCourse.com  Fact Sheet for Healthcare Providers: SeriousBroker.it  This test is not yet  approved or cleared by the [place] and has been authorized for detection and/or diagnosis of SARS-CoV-2 by FDA under an Emergency Use Authorization (EUA). This EUA will remain in effect (meaning this test can be used) for the duration of the COVID-19 declaration under Section 564(b)(1) of the Act, 21 U.S.C. section 360bbb-3(b)(1), unless the authorization  is terminated or revoked.     Resp Syncytial Virus by PCR NEGATIVE NEGATIVE Final    Comment: (NOTE) Fact Sheet for Patients: BloggerCourse.com  Fact Sheet for Healthcare Providers: SeriousBroker.it  This test is not yet approved or cleared by the [place] FDA and has been authorized for detection and/or diagnosis of SARS-CoV-2 by FDA under an Emergency Use Authorization (EUA). This EUA will remain in effect (meaning this test can be used) for the duration of the COVID-19 declaration under Section 564(b)(1) of the Act, 21 U.S.C. section 360bbb-3(b)(1), unless the authorization is terminated or revoked.  Performed at [facility], [address], [place], [place] [address]          Radiology Studies: MR PELVIS WO CONTRAST Result Date: 05/26/2023 CLINICAL DATA:  Uterine mass (fibroid versus sarcoma), concern for metastatic disease EXAM: MRI PELVIS WITHOUT CONTRAST TECHNIQUE: Multiplanar multisequence MR imaging of the pelvis was performed. No intravenous contrast was administered. COMPARISON:  CT abdomen/pelvis dated 05/25/2023. FINDINGS: Urinary Tract:  Bladder is underdistended but unremarkable. Bowel:  Visualized bowel is grossly unremarkable. Vascular/Lymphatic: No evidence of aneurysm. Small bilateral pelvic sidewall nodes measuring up to 10-11 mm short axis (series 4/image 25), suspicious. Reproductive: Uterus is notable for multiple uterine fibroids (series 4/images 21, 24, and 26), including a dominant 13.3 x 11.4 x 11.8 cm intramural fibroid in the left anterior uterine body. Suspected right ovary is within normal limits (series 4/image 19). Left ovary is not discretely visualized. Other:  Moderate pelvic ascites. Suspected abnormal peritoneal soft tissue/omental caking beneath the anterior abdominal wall (series 7/images 3 and 5), mostly excluded on this pelvic MR. Musculoskeletal: No focal osseous  lesions. IMPRESSION: Dominant 13.3 cm intramural fibroid in the left anterior uterine body. While uterine leiomyosarcoma cannot be excluded by imaging, the lesion does not demonstrate suspicious imaging findings by MR. Moderate pelvic ascites with suspected peritoneal disease/omental caking beneath the anterior abdominal wall, incompletely visualized. Mild pelvic lymphadenopathy, suspicious for nodal metastases. Assuming this does in fact reflect a benign fibroid, the underlying site of primary malignancy is not evident on this study. Electronically Signed   By: Charline Bills M.D.   On: 05/26/2023 22:08   VAS [place] LOWER EXTREMITY VENOUS (DVT) Result Date: 05/26/2023  Lower Venous DVT Study Patient Name:  SHERETA CROTHERS  Date of Exam:   05/26/2023 Medical Rec #: [id number]   Accession #:    [id number] Date of Birth: April 02, 1965   Patient Gender: F Patient Age:   40 years Exam Location:  [facility] Procedure:      VAS [place] LOWER EXTREMITY VENOUS (DVT) Referring Phys: A POWELL JR --------------------------------------------------------------------------------  Indications: Edema, and pulmonary embolism.  Risk Factors: Confirmed PE. Anticoagulation: Heparin. Comparison Study: No prior studies. Performing Technologist: Chanda Busing RVT  Examination Guidelines: A complete evaluation includes B-mode imaging, spectral Doppler, color Doppler, and power Doppler as needed of all accessible portions of each vessel. Bilateral testing is considered an integral part of a complete examination. Limited examinations for reoccurring indications may be performed as noted. The reflux portion of the exam is performed with the patient in reverse Trendelenburg.  +---------+---------------+---------+-----------+----------+--------------+ RIGHT  CompressibilityPhasicitySpontaneityPropertiesThrombus Aging +---------+---------------+---------+-----------+----------+--------------+ CFV      Full           Yes      Yes                                  +---------+---------------+---------+-----------+----------+--------------+ SFJ      Full                                                        +---------+---------------+---------+-----------+----------+--------------+ FV Prox  Full                                                        +---------+---------------+---------+-----------+----------+--------------+ FV Mid   Full                                                        +---------+---------------+---------+-----------+----------+--------------+ FV DistalPartial        Yes      Yes                  Acute          +---------+---------------+---------+-----------+----------+--------------+ PFV      Full                                                        +---------+---------------+---------+-----------+----------+--------------+ POP      Partial        Yes      Yes                  Acute          +---------+---------------+---------+-----------+----------+--------------+ PTV      None                                         Acute          +---------+---------------+---------+-----------+----------+--------------+ PERO     Partial                                      Acute          +---------+---------------+---------+-----------+----------+--------------+ Gastroc  None                                         Acute          +---------+---------------+---------+-----------+----------+--------------+ SSV      None  Acute          +---------+---------------+---------+-----------+----------+--------------+   +---------+---------------+---------+-----------+----------+--------------+ LEFT     CompressibilityPhasicitySpontaneityPropertiesThrombus Aging +---------+---------------+---------+-----------+----------+--------------+ CFV      Full           Yes      Yes                                  +---------+---------------+---------+-----------+----------+--------------+ SFJ      Full                                                        +---------+---------------+---------+-----------+----------+--------------+ FV Prox  Full                                                        +---------+---------------+---------+-----------+----------+--------------+ FV Mid   Full                                                        +---------+---------------+---------+-----------+----------+--------------+ FV DistalFull                                                        +---------+---------------+---------+-----------+----------+--------------+ PFV      Full                                                        +---------+---------------+---------+-----------+----------+--------------+ POP      None           No       No                   Acute          +---------+---------------+---------+-----------+----------+--------------+ PTV      None                                         Acute          +---------+---------------+---------+-----------+----------+--------------+ PERO     None                                         Acute          +---------+---------------+---------+-----------+----------+--------------+ Gastroc  None                                         Acute          +---------+---------------+---------+-----------+----------+--------------+  SSV      None                                         Acute          +---------+---------------+---------+-----------+----------+--------------+     Summary: RIGHT: - Findings consistent with acute deep vein thrombosis involving the right femoral vein, right popliteal vein, right posterior tibial veins, and right peroneal veins. - Findings consistent with acute superficial vein thrombosis involving the right small saphenous vein. Findings consistent with acute intramuscular thrombosis  involving the right gastrocnemius veins. - No cystic structure found in the popliteal fossa.  LEFT: - Findings consistent with acute deep vein thrombosis involving the left popliteal vein, left posterior tibial veins, and left peroneal veins. - Findings consistent with acute superficial vein thrombosis involving the left small saphenous vein. Findings consistent with acute intramuscular thrombosis involving the left gastrocnemius veins. - No cystic structure found in the popliteal fossa.  *See table(s) above for measurements and observations. Electronically signed by Lemar Livings MD on 05/26/2023 at 3:29:25 PM.    Final    ECHOCARDIOGRAM COMPLETE Result Date: 05/26/2023    ECHOCARDIOGRAM REPORT   Patient Name:   Wrenna Sluka Date of Exam: 05/26/2023 Medical Rec #:  [id number]  Height:       64.0 in Accession #:    [id number] Weight:       209.0 lb Date of Birth:  [birth year]  BSA:          1.993 m Patient Age:    57 years   BP:           122/70 mmHg Patient Gender: F          HR:           94 bpm. Exam Location:  Inpatient Procedure: 2D Echo, Color Doppler and Cardiac Doppler (Both Spectral and Color            Flow Doppler were utilized during procedure). Indications:    Pulmonary Embolus I26.09  History:        Patient has no prior history of Echocardiogram examinations.  Sonographer:    Harriette Bouillon RDCS Referring Phys: [phone] A CALDWELL POWELL JR IMPRESSIONS  1. Left ventricular ejection fraction, by estimation, is 60 to 65%. The left ventricle has normal function. The left ventricle has no regional wall motion abnormalities. There is mild concentric left ventricular hypertrophy. Left ventricular diastolic parameters are consistent with Grade I diastolic dysfunction (impaired relaxation).  2. Right ventricular systolic function is normal. The right ventricular size is normal. Tricuspid regurgitation signal is inadequate for assessing PA pressure.  3. The mitral valve is normal in structure. No evidence of mitral  valve regurgitation. No evidence of mitral stenosis.  4. The aortic valve is tricuspid. Aortic valve regurgitation is not visualized. No aortic stenosis is present.  5. The inferior vena cava is normal in size with greater than 50% respiratory variability, suggesting right atrial pressure of 3 mmHg.  6. Left pleural effusion present. FINDINGS  Left Ventricle: Left ventricular ejection fraction, by estimation, is 60 to 65%. The left ventricle has normal function. The left ventricle has no regional wall motion abnormalities. Strain imaging was not performed. The left ventricular internal cavity  size was normal in size. There is mild concentric left ventricular hypertrophy. Left ventricular diastolic parameters are consistent with Grade I diastolic dysfunction (impaired relaxation). Right  Ventricle: The right ventricular size is normal. No increase in right ventricular wall thickness. Right ventricular systolic function is normal. Tricuspid regurgitation signal is inadequate for assessing PA pressure. Left Atrium: Left atrial size was normal in size. Right Atrium: Right atrial size was normal in size. Pericardium: Left pleural effusion present. There is no evidence of pericardial effusion. Mitral Valve: The mitral valve is normal in structure. No evidence of mitral valve regurgitation. No evidence of mitral valve stenosis. Tricuspid Valve: The tricuspid valve is normal in structure. Tricuspid valve regurgitation is not demonstrated. Aortic Valve: The aortic valve is tricuspid. Aortic valve regurgitation is not visualized. No aortic stenosis is present. Pulmonic Valve: The pulmonic valve was normal in structure. Pulmonic valve regurgitation is not visualized. Aorta: The aortic root is normal in size and structure. Venous: The inferior vena cava is normal in size with greater than 50% respiratory variability, suggesting right atrial pressure of 3 mmHg. IAS/Shunts: No atrial level shunt detected by color flow Doppler.  Additional Comments: 3D imaging was not performed.  LEFT VENTRICLE PLAX 2D LVIDd:         3.60 cm   Diastology LVIDs:         2.30 cm   LV e' medial:    4.68 cm/s LV PW:         1.20 cm   LV E/e' medial:  18.5 LV IVS:        1.20 cm   LV e' lateral:   9.25 cm/s LVOT diam:     2.00 cm   LV E/e' lateral: 9.3 LV SV:         67 LV SV Index:   34 LVOT Area:     3.14 cm  RIGHT VENTRICLE             IVC RV S prime:     10.80 cm/s  IVC diam: 1.60 cm TAPSE (M-mode): 1.7 cm LEFT ATRIUM             Index        RIGHT ATRIUM           Index LA diam:        2.60 cm 1.30 cm/m   RA Area:     13.40 cm LA Vol (A2C):   45.9 ml 23.03 ml/m  RA Volume:   32.00 ml  16.05 ml/m LA Vol (A4C):   32.3 ml 16.20 ml/m LA Biplane Vol: 41.5 ml 20.82 ml/m  AORTIC VALVE LVOT Vmax:   129.00 cm/s LVOT Vmean:  83.900 cm/s LVOT VTI:    0.213 m  AORTA Ao Root diam: 2.70 cm Ao Asc diam:  3.60 cm MITRAL VALVE MV Area (PHT): 6.54 cm     SHUNTS MV Decel Time: 116 msec     Systemic VTI:  0.21 m MV E velocity: 86.40 cm/s   Systemic Diam: 2.00 cm MV A velocity: 114.00 cm/s MV E/A ratio:  0.76 Dalton McleanMD Electronically signed by Wilfred Lacy Signature Date/Time: 05/26/2023/12:50:45 PM    Final    CT Angio Chest PE W/Cm &/Or Wo Cm Result Date: 05/25/2023 CLINICAL DATA:  Intermittent shortness of breath for 2 weeks, left-sided chest pain EXAM: CT ANGIOGRAPHY CHEST WITH CONTRAST TECHNIQUE: Multidetector CT imaging of the chest was performed using the standard protocol during bolus administration of intravenous contrast. Multiplanar CT image reconstructions and MIPs were obtained to evaluate the vascular anatomy. RADIATION DOSE REDUCTION: This exam was performed according to the departmental dose-optimization program which includes automated  exposure control, adjustment of the mA and/or kV according to patient size and/or use of iterative reconstruction technique. CONTRAST:  OMNIPAQUE IOHEXOL 350 MG/ML SOLN COMPARISON:  05/25/2023 FINDINGS:  Cardiovascular: This is a technically adequate evaluation of the pulmonary vasculature. There are bilateral pulmonary emboli, primarily within the segmental vessels of the left upper, right upper, and right lower lobes. Mild to moderate clot burden. No evidence of right heart strain. The heart is unremarkable without pericardial effusion. No evidence of thoracic aortic aneurysm or dissection. Atherosclerosis of the aorta and coronary vasculature. Mediastinum/Nodes: Nonspecific subcentimeter lymph nodes throughout the mediastinum and hilar regions. Thyroid, trachea, and esophagus are grossly unremarkable. Lungs/Pleura: Moderate bilateral pleural effusions, right greater than left, with significant compressive atelectasis within the bilateral lower lobes. There is also complete consolidation with volume loss in the right middle lobe compatible with atelectasis. This is of uncertain chronicity. No evidence of central obstructing mass. No airspace disease or pneumothorax.  Central airways are patent. Upper Abdomen: Small volume ascites.  Multiple hepatic cysts. Musculoskeletal: No acute or destructive bony abnormalities. Reconstructed images demonstrate no additional findings. Review of the MIP images confirms the above findings. IMPRESSION: 1. Bilateral segmental pulmonary emboli. Mild moderate clot burden with no evidence of right heart strain. 2. Moderate bilateral pleural effusions, with extensive compressive atelectasis throughout the lower lobes. 3. Complete atelectasis of the right middle lobe, of uncertain chronicity. No central obstructing lesion identified. 4. Numerous subcentimeter mediastinal and hilar lymph nodes, nonspecific. No pathologic adenopathy. 5. Upper abdominal ascites. 6. Aortic Atherosclerosis (ICD10-I70.0). Coronary artery atherosclerosis. Critical Value/emergent results were called by telephone at the time of interpretation on 05/25/2023 at 3:46 pm to provider [facility] , who verbally  acknowledged these results. Electronically Signed   By: Sharlet Salina M.D.   On: 05/25/2023 15:54   CT ABDOMEN PELVIS W CONTRAST Result Date: 05/25/2023 CLINICAL DATA:  Abdominal pain, acute, nonlocalized. EXAM: CT ABDOMEN AND PELVIS WITH CONTRAST TECHNIQUE: Multidetector CT imaging of the abdomen and pelvis was performed using the standard protocol following bolus administration of intravenous contrast. RADIATION DOSE REDUCTION: This exam was performed according to the departmental dose-optimization program which includes automated exposure control, adjustment of the mA and/or kV according to patient size and/or use of iterative reconstruction technique. CONTRAST:  OMNIPAQUE IOHEXOL 350 MG/ML SOLN COMPARISON:  CT scan abdomen and pelvis from 04/01/2003. FINDINGS: Lower chest: Bilateral moderate to large pleural effusions noted with associated compressive atelectatic changes. The heart is normal in size. No pericardial effusion. Hepatobiliary: The liver is normal in size. Non-cirrhotic configuration. There are at least 2 simple cysts in the liver with largest measuring up to 3.3 x 3.4 cm. The cysts are present since the prior study dating back to 2005 and exhibits significant interval growth. No intrahepatic or extrahepatic bile duct dilation. Gallbladder is surgically absent. Pancreas: Unremarkable. No pancreatic ductal dilatation or surrounding inflammatory changes. Spleen: Within normal limits. No focal lesion. Adrenals/Urinary Tract: Adrenal glands are unremarkable. No suspicious renal mass. No hydronephrosis. No renal or ureteric calculi. Urinary bladder is under distended, precluding optimal assessment. However, no large mass or stones identified. No perivesical fat stranding. Stomach/Bowel: No disproportionate dilation of the small or large bowel loops. No evidence of abnormal bowel wall thickening or inflammatory changes. The appendix is unremarkable. Vascular/Lymphatic: There is small-to-moderate  ascites mainly in the perihepatic/perisplenic region, dependent pelvis and bilateral paracolic gutters. There are soft tissue nodularity/stranding in the upper abdomen, which is nonspecific but can be seen with ascites or  due to peritoneal carcinomatosis. No pneumoperitoneum. There are mildly enlarged retroperitoneal lymph nodes, which are indeterminate in etiology. No aneurysmal dilation of the major abdominal arteries. There are mild peripheral atherosclerotic vascular calcifications of the aorta and its major branches. Reproductive: The uterus is enlarged secondary to a markedly enlarged 12.9 x 13.7 cm heterogeneous hypoattenuating area in the fundus/uterine body region. The uterus is probably pushed to the right and inferiorly however, not well evaluated. Endometrium is not seen. Bilateral ovaries are not distinctly visualized on this exam however, no large adnexal mass seen. Other: There is a small fat containing umbilical hernia. The soft tissues and abdominal wall are otherwise unremarkable. Musculoskeletal: No suspicious osseous lesions. There are mild multilevel degenerative changes in the visualized spine. IMPRESSION: 1. Enlarged uterus secondary to a large heterogeneous hypoattenuating area in the fundus/uterine body region. This is not well evaluated on the current exam. Differential diagnosis includes benign versus malignant uterine mass. Correlate clinically. Consider additional imaging as indicated. 2. There is small-to-moderate ascites. There is also soft tissue nodularity/stranding in the upper abdomen, which is nonspecific but can be seen with ascites or due to peritoneal carcinomatosis. 3. Bilateral moderate to large pleural effusions with associated compressive atelectatic changes. 4. Multiple other nonacute observations, as described above. Aortic Atherosclerosis (ICD10-I70.0). Electronically Signed   By: Jules Schick M.D.   On: 05/25/2023 15:44        Scheduled Meds:  furosemide  40  mg Intravenous BID   levothyroxine  150 mcg Oral QAC breakfast   polyethylene glycol  17 g Oral BID   Continuous Infusions:  heparin 1,700 Units/hr (05/27/23 0638)     LOS: 2 days    Time spent: 52 minutes spent on chart review, discussion with nursing staff, consultants, updating family and interview/physical exam; more than 50% of that time was spent in counseling and/or coordination of care.    Alvira Philips [place], DO [facility] Available via Epic secure chat 7am-7pm After these hours, please refer to coverage provider listed on [email or web address] 05/27/2023, 12:40 PM

## 2023-05-28 ENCOUNTER — Inpatient Hospital Stay (HOSPITAL_COMMUNITY): Payer: BLUE CROSS/BLUE SHIELD

## 2023-05-28 ENCOUNTER — Other Ambulatory Visit: Payer: Self-pay

## 2023-05-28 DIAGNOSIS — I2699 Other pulmonary embolism without acute cor pulmonale: Secondary | ICD-10-CM | POA: Diagnosis not present

## 2023-05-28 LAB — CBC
HCT: 50.9 % — ABNORMAL HIGH (ref 36.0–46.0)
Hemoglobin: 15.9 g/dL — ABNORMAL HIGH (ref 12.0–15.0)
MCH: 28.9 pg (ref 26.0–34.0)
MCHC: 31.2 g/dL (ref 30.0–36.0)
MCV: 92.4 fL (ref 80.0–100.0)
Platelets: 339 10*3/uL (ref 150–400)
RBC: 5.51 MIL/uL — ABNORMAL HIGH (ref 3.87–5.11)
RDW: 13.1 % (ref 11.5–15.5)
WBC: 8.2 10*3/uL (ref 4.0–10.5)
nRBC: 0 % (ref 0.0–0.2)

## 2023-05-28 LAB — BODY FLUID CELL COUNT WITH DIFFERENTIAL
Eos, Fluid: 9 %
Lymphs, Fluid: 63 %
Monocyte-Macrophage-Serous Fluid: 25 % — ABNORMAL LOW (ref 50–90)
Neutrophil Count, Fluid: 2 % (ref 0–25)

## 2023-05-28 LAB — LACTATE DEHYDROGENASE, PLEURAL OR PERITONEAL FLUID: LD, Fluid: 265 U/L — ABNORMAL HIGH (ref 3–23)

## 2023-05-28 LAB — PROTEIN, PLEURAL OR PERITONEAL FLUID: Total protein, fluid: 4.6 g/dL

## 2023-05-28 LAB — HEPARIN LEVEL (UNFRACTIONATED)
Heparin Unfractionated: 0.5 [IU]/mL (ref 0.30–0.70)
Heparin Unfractionated: 0.45 [IU]/mL (ref 0.30–0.70)

## 2023-05-28 MED ORDER — FUROSEMIDE 10 MG/ML IJ SOLN
60.0000 mg | Freq: Two times a day (BID) | INTRAMUSCULAR | Status: DC
  Administered 2023-05-29 – 2023-05-31 (×5): 60 mg via INTRAVENOUS
  Filled 2023-05-28 (×6): qty 6

## 2023-05-28 MED ORDER — LIDOCAINE HCL 1 % IJ SOLN
INTRAMUSCULAR | Status: AC
  Filled 2023-05-28: qty 20

## 2023-05-28 NOTE — Progress Notes (Signed)
 GYN Oncology Progress Note  Hx:  58 year old female currently being admitted with bilateral PEs found on imaging along with ascites, pleural effusions, uterine mass. An endometrial biopsy was performed on 05/26/23 with results pending. CA 125 at 1,092. CEA at 0.9. CA 19-9 at 73.   Interval: She reports doing well. She states her breathing status is manageable if she doesn't talk too much. Denies pain. She is tolerating her diet with no nausea or emesis. Voiding without difficulty. Had a BM yesterday x 2. No needs voiced.  Exam: Alert, oriented, in no acute distress. Lungs diminished in the bases, unlabored, no wheezing/rhonchi. Abdomen is soft with active bowel sounds. No lower extremity edema bilaterally.  MRI IMPRESSION: -Dominant 13.3 cm intramural fibroid in the left anterior uterine body. While uterine leiomyosarcoma cannot be excluded by imaging, the lesion does not demonstrate suspicious imaging findings by MR.  -Moderate pelvic ascites with suspected peritoneal disease/omental caking beneath the anterior abdominal wall, incompletely visualized. -Mild pelvic lymphadenopathy, suspicious for nodal metastases. -Assuming this does in fact reflect a benign fibroid, the underlying site of primary malignancy is not evident on this study.  Assessment/plan: 58 year old female currently being admitted with bilateral PEs found on imaging along with ascites, pleural effusions, uterine mass. MRI images reviewed by. Dr. Alvester Morin on 05/27/23 who feels the fibroid appears benign. Awaiting results of endometrial biopsy performed 05/26/2023, immunos ordered on 05/27/23. Tumor markers discussed with patient. Paracentesis and thoracentesis ordered for fluid to go to cytology. Will continue to follow results. No needs voiced pt patient at this time. Continue with current plan of care.

## 2023-05-28 NOTE — TOC Initial Note (Signed)
 Transition of Care [facility]) - Initial/Assessment Note    Patient Details  Name: Sharon Hensley MRN: [id number] Date of Birth: [birth year]  [facility] CM/SW Contact:    Lanier Clam, RN Phone Number: 05/28/2023, 11:11 AM  Clinical Narrative:d/c plan home.                   Expected Discharge Plan: Home/Self Care Barriers to Discharge: Continued Medical Work up   Patient Goals and CMS Choice Patient states their goals for this hospitalization and ongoing recovery are:: Home CMS Medicare.gov Compare Post Acute Care list provided to:: Patient Choice offered to / list presented to : Patient [place] ownership interest in [facility].provided to:: Patient    Expected Discharge Plan and Services                                              Prior Living Arrangements/Services                       Activities of Daily Living   ADL Screening (condition at time of admission) Independently performs ADLs?: Yes (appropriate for developmental age) Is the patient deaf or have difficulty hearing?: No Does the patient have difficulty seeing, even when wearing glasses/contacts?: No Does the patient have difficulty concentrating, remembering, or making decisions?: No  Permission Sought/Granted                  Emotional Assessment              Admission diagnosis:  Shortness of breath [R06.02] Sinus tachycardia [R00.0] Pulmonary embolism (HCC) [I26.99] Other ascites [R18.8] Uterine mass [N85.8] Bilateral pleural effusion [J90] Bilateral pulmonary embolism (HCC) [I26.99] Acute respiratory failure with hypoxia (HCC) [J96.01] Patient Active Problem List   Diagnosis Date Noted   Bilateral pulmonary embolism (HCC) 05/25/2023   Shortness of breath 05/25/2023   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 ([address]), [place] - [address] [place] ([place]) [place] [address] Phone: [phone] Fax: [phone]     Social Drivers of Health (SDOH) Social History: SDOH Screenings   Food Insecurity: No Food Insecurity (05/26/2023)  Housing: Low Risk  (05/26/2023)  Transportation Needs: No Transportation Needs (05/26/2023)  Utilities: Not At Risk (05/26/2023)  Tobacco Use: Medium Risk (05/25/2023)   SDOH Interventions:     Readmission Risk Interventions     No data to display

## 2023-05-28 NOTE — Progress Notes (Signed)
 Patient transferred to and from the radiology dept with RN for a chest xray via wheelchair. Patient alert and oriented in no acute distress. No signs of respiratory distress noted on 4L [place]. Patient positioned comfortably for imaging. No complications noted while transferring back to the patient's room.

## 2023-05-28 NOTE — Progress Notes (Addendum)
 PHARMACY - ANTICOAGULATION CONSULT NOTE  Pharmacy Consult for IV Heparin Indication: pulmonary embolus  Allergies  Allergen Reactions   Clindamycin/Lincomycin Anaphylaxis   Hydrochlorothiazide Other (See Comments)    "Made me feel like cement"- patient does not wish to take this again   Penicillins Hives, Itching and Other (See Comments)    Patient said she can tolerate Amoxicillin   Tape Rash    Patient Measurements: Height: 5\' 4"  (162.6 cm) Weight: 91.3 kg (201 lb 3.2 oz) IBW/kg (Calculated) : 54.7 Heparin dosing weight: 76 kg  Vital Signs: Temp: 98 F (36.7 C) (02/27 1226) Temp Source: Oral (02/27 1226) BP: 132/89 (02/27 1226) Pulse Rate: 101 (02/27 1226)  Labs: Recent Labs    05/26/23 0525 05/26/23 1322 05/27/23 0455 05/27/23 1226 05/27/23 2131 05/28/23 0521 05/28/23 1238  HGB 14.1  --  14.1  --   --  15.9*  --   HCT 44.0  --  45.5  --   --  50.9*  --   PLT 272  --  270  --   --  339  --   HEPARINUNFRC  --    < > 0.25*   < > 0.29* 0.50 0.45  CREATININE 0.68  --  0.65  --   --   --   --    < > = values in this interval not displayed.    Estimated Creatinine Clearance: 84.9 mL/min (by C-G formula based on SCr of 0.65 mg/dL).  Assessment: 30 yoF presents with worsening SOB/chest pain; found to have bilateral segmental PEs (no RH strain noted). Pharmacy consulted to dose IV heparin  Today, 05/28/2023: Daily heparin level now therapeutic again after rate increase to 1900 units/hr Hgb slightly elevated (c/w admission readings); Plt stable WNL SCr stable < 1.0 (baseline) No s/sx of bleeding or infusion issues noted  Goal of Therapy:  Heparin level 0.3-0.7 Monitor platelets by anticoagulation protocol: Yes   Plan:  Continue heparin infusion at 1900 units/hr  Recheck confirmatory heparin level in 6 hours Daily heparin level and CBC Monitor for s/sx of bleeding daily  Bernadene Person, PharmD, BCPS [phone] 05/28/2023, 2:53 PM  ADDENDUM Confirmatory  heparin level remains therapeutic and stable on 1900 units/hr No bleeding/infusion issues per RN Daily heparin level Plan transition to long term anticoagulation pending completion of any needed invasive surgical/diagnostic procedures  Bernadene Person, PharmD, BCPS [phone] 05/28/2023, 2:53 PM

## 2023-05-28 NOTE — Plan of Care (Signed)

## 2023-05-28 NOTE — Procedures (Signed)
 PROCEDURE SUMMARY:  Successful US guided right thoracentesis. Yielded 920 of clear amber fluid. Patient tolerated procedure well. No immediate complications. EBL = trace  Specimen was sent for labs.  Post procedure chest X-ray reveals no pneumothorax  Loman Brooklyn PA-C 05/28/2023 11:21 AM

## 2023-05-28 NOTE — Progress Notes (Signed)
 PROGRESS NOTE    Sharon Hensley  WUJ:[id number] DOB: [birth year] DOA: 05/25/2023 PCP: Gwenyth Bender, FNP    Brief Narrative:   Sharon Hensley is a 58 y.o. female with past medical history significant for hypothyroidism who presented to [facility] ED on 05/25/2023 with complaints of progressive shortness of breath, left-sided chest pain over the last 2 weeks.  Endorses shortness of breath with both rest and exercise.  She has chronic orthopnea and has been sleeping in the chair for years.  Recently started on hydrochlorothiazide due to lower extremity swelling over the last few weeks, but this was discontinued because she did not like how it made her feel.  Denies fever/chills, no abdominal pain, no nausea/vomiting.  In the ED, temperature 98.1 F, HR 109, RR 24, BP 133/93, SpO2 91% on room air.  WBC 8.7, hemoglobin 15.5, platelet count 270.  Sodium 140, potassium 3.7, chloride 104, CO2 25, glucose 175, BUN 8, creatinine 0.63.  High sensitive troponin 2> 2.  BNP 53.2.  D-dimer 11.97.  Hemoglobin A1c 6.8.  hCG negative.  TSH 6.108, free T41.06.  Influenza A/B, RSV, COVID PCR negative.  Urinalysis unrevealing.  Chest x-ray with bilateral small/moderate pleural effusions with probable underlying compressive atelectatic changes, no pneumothorax.  CT angiogram chest with bilateral segmental pulm emboli, mild/moderate clot burden with no evidence of right heart strain, moderate bilateral pleural effusions with extensive compressive atelectasis throughout the lower lobes, complete atelectasis of the right middle lobe, no central obstructing lesion identified, multiple subcentimeter mediastinal and hilar lymph nodes nonspecific, upper abdominal ascites, aortic atherosclerosis.  CT abdomen/pelvis with contrast enlarged uterus secondary to a large heterogeneous hypoattenuating area in the fundus/uterine body concern for benign versus malignant uterine mass, small/moderate ascites, soft tissue nodularity/stranding  in the upper abdomen, bilateral moderate to large pleural effusions with associated compressive atelectatic changes.  Patient was started on heparin drip. Gyn/onc was consulted given PE, ascites and uterine mass, previously thought to be benign concerning for malignancy.  TRH consulted for admission.    Assessment & Plan:   Acute hypoxic respiratory failure Patient with worsening hypoxia during hospitalization with etiology likely secondary to bilateral segmental pulmonary emboli, and pleural effusions. -- Continue supplemental oxygen, maintain SpO2 greater than 92% -- Continue treatment as below  Pulmonary embolism, bilaterally Patient presenting with progressive shortness of breath, left-sided chest pain.  D-dimer elevated 11.97.  CT angiogram chest with bilateral segmental pulmonary emboli with mild/moderate clot burden with no evidence of right heart strain.  TTE with LVEF 60-65%, LV with no regional wall motion abnormalities, mild concentric LVH, grade 1 diastolic dysfunction, no AR stenosis, IVC normal in size, RV systolic function normal. -- Heparin drip, pharmacy consulted for dosing/monitoring  Acute DVT Vascular duplex ultrasound bilateral lower extremities with findings consistent with acute DVT right femoral vein, right popliteal vein, right posterior tibial veins, right peroneal veins, left popliteal vein, left posterior tibial veins, left peroneal veins. -- Continue heparin drip as above  Bilateral pleural effusions Imaging notable for bilateral pleural effusions with compressive atelectasis. -- S/p right thoracentesis by IR 2/27 with 920 mL amber-colored fluid removed -- Pleural fluid cytology: Pending -- Furosemide 40 mg IV every 12 hours -- Likely will repeat thoracentesis on left tomorrow  Uterine mass with ascites, pleural effusion, peritoneal disease/omental caking concerning for malignancy Patient presenting with progressive shortness of breath found to have an acute  pulmonary embolism also with associated ascites, pleural effusion on CT imaging.  Pancreas noted to be unremarkable with  no pancreatic ductal dilation or surrounding inflammatory changes on CT abdomen with contrast.  Uterine mass was previously evaluated in 2022 and thought was benign.  MR pelvis without contrast with 13.3 cm intramural fibroid left anterior uterine body, unable to exclude leiomyosarcoma, moderate pelvic ascites with suspected peritoneal disease/omental caking beneath anterior abdominal wall, mild pelvic lymphadenopathy suspicious for nodal metastasis.  Patient underwent endometrial biopsy by GYN/oncology 05/26/2023.  Patient reports family history of colon cancer and renal cell carcinoma, denies any previous colonoscopies but has undergone Cologuard testing that has been reported negative -- GYN/Onc following; appreciate assistance -- CEA 0.9, wNL -- CA 19-9 elevated 73 -- CA 125 elevated 1092.0 -- Pathology from EMB: Pending  Hypothyroidism TSH 6.108, free T41.06. -- Continue home levothyroxine 150 mcg p.o. daily -- Repeat TFTs in 4-6 weeks  DVT prophylaxis: Heparin drip    Code Status: Full Code Family Communication: Patient's partner who is present at bedside this morning  Disposition Plan:  Level of care: Telemetry Status is: Inpatient Remains inpatient appropriate because: IV diuresis, further workup for concern of underlying malignancy    Consultants:  GYN/Onc  Procedures:  Vascular duplex ultrasound bilateral lower extremities TTE Right thoracentesis, IR 2/27  Antimicrobials:  None   Subjective: Patient seen examined bedside, doing upright in bed.  Partner present at bedside.  Reports dyspnea slightly improved.  Attempted ultrasound-guided paracentesis, no significant amount of fluid available so underwent right-sided thoracentesis this morning with 920 mL of amber-colored fluid removed.  Pathology for endometrial biopsy remains pending.  No other specific  questions, concerns or complaints at this time.  Denies headache, no visual changes, no chest pain, no palpitations, no nausea/vomiting/diarrhea, no fever/chills/night sweats, no abdominal pain, no focal weakness, no fatigue, no paresthesias.  No acute events overnight per nursing staff.  Objective: Vitals:   05/28/23 0500 05/28/23 1101 05/28/23 1117 05/28/23 1226  BP:  (!) 152/86 (!) 137/92 132/89  Pulse:    (!) 101  Resp:    18  Temp:    98 F (36.7 C)  TempSrc:    Oral  SpO2:    96%  Weight: 91.3 kg     Height:        Intake/Output Summary (Last 24 hours) at 05/28/2023 1338 Last data filed at 05/28/2023 1230 Gross per 24 hour  Intake 480 ml  Output 2000 ml  Net -1520 ml   Filed Weights   05/25/23 1039 05/27/23 0500 05/28/23 0500  Weight: 94.8 kg 91.9 kg 91.3 kg    Examination:  Physical Exam: GEN: NAD, alert and oriented x 3, obese HEENT: NCAT, PERRL, EOMI, sclera clear, MMM PULM: Breath sounds diminished bilateral bases, no wheezing/crackles, normal respiratory effort without accessory muscle use, on 4 L nasal cannula with SpO2 96% at rest CV: RRR w/o M/G/R GI: abd soft, NTND, NABS, no R/G/M MSK: + Trace lower extremity peripheral edema, muscle strength globally intact 5/5 bilateral upper/lower extremities NEURO: CN II-XII intact, no focal deficits, sensation to light touch intact PSYCH: normal mood/affect Integumentary: dry/intact, no rashes or wounds    Data Reviewed: I have personally reviewed following labs and imaging studies  CBC: Recent Labs  Lab 05/25/23 1107 05/26/23 0525 05/27/23 0455 05/28/23 0521  WBC 8.7 8.3 8.8 8.2  HGB 15.5* 14.1 14.1 15.9*  HCT 49.1* 44.0 45.5 50.9*  MCV 91.8 91.3 95.0 92.4  PLT 270 272 270 339   Basic Metabolic Panel: Recent Labs  Lab 05/25/23 1107 05/26/23 0525 05/27/23 0455  NA 140 143  139  K 3.7 3.7 3.7  CL 104 104 100  CO2 25 28 27   GLUCOSE 175* 152* 135*  BUN 8 9 11   CREATININE 0.63 0.68 0.65  CALCIUM 10.2  10.3 9.9  MG  --   --  2.2  PHOS  --   --  3.1   GFR: Estimated Creatinine Clearance: 84.9 mL/min (by C-G formula based on SCr of 0.65 mg/dL). Liver Function Tests: Recent Labs  Lab 05/25/23 1341 05/26/23 0525  AST 27 19  ALT 29 27  ALKPHOS 105 102  BILITOT 0.6 0.5  PROT 7.3 6.8  ALBUMIN 3.9 3.6   No results for input(s): "LIPASE", "AMYLASE" in the last 168 hours. No results for input(s): "AMMONIA" in the last 168 hours. Coagulation Profile: No results for input(s): "INR", "PROTIME" in the last 168 hours. Cardiac Enzymes: No results for input(s): "CKTOTAL", "CKMB", "CKMBINDEX", "TROPONINI" in the last 168 hours. BNP (last 3 results) No results for input(s): "PROBNP" in the last 8760 hours. HbA1C: No results for input(s): "HGBA1C" in the last 72 hours.  CBG: No results for input(s): "GLUCAP" in the last 168 hours. Lipid Profile: No results for input(s): "CHOL", "HDL", "LDLCALC", "TRIG", "CHOLHDL", "LDLDIRECT" in the last 72 hours. Thyroid Function Tests: No results for input(s): "TSH", "T4TOTAL", "FREET4", "T3FREE", "THYROIDAB" in the last 72 hours.  Anemia Panel: No results for input(s): "VITAMINB12", "FOLATE", "FERRITIN", "TIBC", "IRON", "RETICCTPCT" in the last 72 hours. Sepsis Labs: No results for input(s): "PROCALCITON", "LATICACIDVEN" in the last 168 hours.  Recent Results (from the past 240 hours)  Resp panel by RT-PCR (RSV, Flu A&B, Covid) Anterior Nasal Swab     Status: None   Collection Time: 05/25/23 12:12 PM   Specimen: Anterior Nasal Swab  Result Value Ref Range Status   SARS Coronavirus 2 by RT PCR NEGATIVE NEGATIVE Final    Comment: (NOTE) SARS-CoV-2 target nucleic acids are NOT DETECTED.  The SARS-CoV-2 RNA is generally detectable in upper respiratory specimens during the acute phase of infection. The lowest concentration of SARS-CoV-2 viral copies this assay can detect is 138 copies/mL. A negative result does not preclude SARS-Cov-2 infection and  should not be used as the sole basis for treatment or other patient management decisions. A negative result may occur with  improper specimen collection/handling, submission of specimen other than nasopharyngeal swab, presence of viral mutation(s) within the areas targeted by this assay, and inadequate number of viral copies(<138 copies/mL). A negative result must be combined with clinical observations, patient history, and epidemiological information. The expected result is Negative.  Fact Sheet for Patients:  BloggerCourse.com  Fact Sheet for Healthcare Providers:  SeriousBroker.it  This test is no t yet approved or cleared by the [place] FDA and  has been authorized for detection and/or diagnosis of SARS-CoV-2 by FDA under an Emergency Use Authorization (EUA). This EUA will remain  in effect (meaning this test can be used) for the duration of the COVID-19 declaration under Section 564(b)(1) of the Act, 21 U.S.C.section 360bbb-3(b)(1), unless the authorization is terminated  or revoked sooner.       Influenza A by PCR NEGATIVE NEGATIVE Final   Influenza B by PCR NEGATIVE NEGATIVE Final    Comment: (NOTE) The Xpert Xpress SARS-CoV-2/FLU/RSV plus assay is intended as an aid in the diagnosis of influenza from Nasopharyngeal swab specimens and should not be used as a sole basis for treatment. Nasal washings and aspirates are unacceptable for Xpert Xpress SARS-CoV-2/FLU/RSV testing.  Fact Sheet for Patients: BloggerCourse.com  Fact Sheet for Healthcare Providers: SeriousBroker.it  This test is not yet approved or cleared by the [place] FDA and has been authorized for detection and/or diagnosis of SARS-CoV-2 by FDA under an Emergency Use Authorization (EUA). This EUA will remain in effect (meaning this test can be used) for the duration of the COVID-19 declaration  under Section 564(b)(1) of the Act, 21 U.S.C. section 360bbb-3(b)(1), unless the authorization is terminated or revoked.     Resp Syncytial Virus by PCR NEGATIVE NEGATIVE Final    Comment: (NOTE) Fact Sheet for Patients: BloggerCourse.com  Fact Sheet for Healthcare Providers: SeriousBroker.it  This test is not yet approved or cleared by the [place] FDA and has been authorized for detection and/or diagnosis of SARS-CoV-2 by FDA under an Emergency Use Authorization (EUA). This EUA will remain in effect (meaning this test can be used) for the duration of the COVID-19 declaration under Section 564(b)(1) of the Act, 21 U.S.C. section 360bbb-3(b)(1), unless the authorization is terminated or revoked.  Performed at [facility], [address] [place], [place] [address]          Radiology Studies: DG Chest 2 View Result Date: 05/28/2023 CLINICAL DATA:  Pleural effusion. EXAM: CHEST - 2 VIEW COMPARISON:  May 25, 2023. FINDINGS: Stable cardiomediastinal silhouette. Stable bilateral pleural effusions with associated bibasilar atelectasis. Bony thorax is unremarkable. IMPRESSION: Stable bilateral pleural effusions with associated bibasilar atelectasis. Electronically Signed   By: Lupita Raider M.D.   On: 05/28/2023 09:50   MR PELVIS WO CONTRAST Result Date: 05/26/2023 CLINICAL DATA:  Uterine mass (fibroid versus sarcoma), concern for metastatic disease EXAM: MRI PELVIS WITHOUT CONTRAST TECHNIQUE: Multiplanar multisequence MR imaging of the pelvis was performed. No intravenous contrast was administered. COMPARISON:  CT abdomen/pelvis dated 05/25/2023. FINDINGS: Urinary Tract:  Bladder is underdistended but unremarkable. Bowel:  Visualized bowel is grossly unremarkable. Vascular/Lymphatic: No evidence of aneurysm. Small bilateral pelvic sidewall nodes measuring up to 10-11 mm short axis (series 4/image 25),  suspicious. Reproductive: Uterus is notable for multiple uterine fibroids (series 4/images 21, 24, and 26), including a dominant 13.3 x 11.4 x 11.8 cm intramural fibroid in the left anterior uterine body. Suspected right ovary is within normal limits (series 4/image 19). Left ovary is not discretely visualized. Other:  Moderate pelvic ascites. Suspected abnormal peritoneal soft tissue/omental caking beneath the anterior abdominal wall (series 7/images 3 and 5), mostly excluded on this pelvic MR. Musculoskeletal: No focal osseous lesions. IMPRESSION: Dominant 13.3 cm intramural fibroid in the left anterior uterine body. While uterine leiomyosarcoma cannot be excluded by imaging, the lesion does not demonstrate suspicious imaging findings by MR. Moderate pelvic ascites with suspected peritoneal disease/omental caking beneath the anterior abdominal wall, incompletely visualized. Mild pelvic lymphadenopathy, suspicious for nodal metastases. Assuming this does in fact reflect a benign fibroid, the underlying site of primary malignancy is not evident on this study. Electronically Signed   By: Charline Bills M.D.   On: 05/26/2023 22:08        Scheduled Meds:  furosemide  40 mg Intravenous BID   levothyroxine  150 mcg Oral QAC breakfast   polyethylene glycol  17 g Oral BID   Continuous Infusions:  heparin 1,900 Units/hr (05/28/23 0354)     LOS: 3 days    Time spent: 52 minutes spent on chart review, discussion with nursing staff, consultants, updating family and interview/physical exam; more than 50% of that time was spent in counseling and/or coordination of care.    Minerva Areola  J [place], DO [facility] Available via Epic secure chat 7am-7pm After these hours, please refer to coverage provider listed on [email or web address] 05/28/2023, 1:38 PM

## 2023-05-28 NOTE — Procedures (Signed)
 Patient presents for therapeutic and diagnostic paracentesis. [place] limited abdomen shows trace amount of peritoneal fluid noted  Insufficient to perform a safe paracentesis. Procedure not performed.  Thank you for allowing our service to participate in Sharon Hensley 's care.  Electronically Signed: Loman Brooklyn, PA-C   05/28/2023, 11:20 AM

## 2023-05-29 ENCOUNTER — Inpatient Hospital Stay (HOSPITAL_COMMUNITY): Payer: BLUE CROSS/BLUE SHIELD

## 2023-05-29 DIAGNOSIS — I2699 Other pulmonary embolism without acute cor pulmonale: Secondary | ICD-10-CM | POA: Diagnosis not present

## 2023-05-29 LAB — CBC
HCT: 46.9 % — ABNORMAL HIGH (ref 36.0–46.0)
Hemoglobin: 14.9 g/dL (ref 12.0–15.0)
MCH: 29 pg (ref 26.0–34.0)
MCHC: 31.8 g/dL (ref 30.0–36.0)
MCV: 91.4 fL (ref 80.0–100.0)
Platelets: 293 10*3/uL (ref 150–400)
RBC: 5.13 MIL/uL — ABNORMAL HIGH (ref 3.87–5.11)
RDW: 12.9 % (ref 11.5–15.5)
WBC: 8.3 10*3/uL (ref 4.0–10.5)
nRBC: 0 % (ref 0.0–0.2)

## 2023-05-29 LAB — BODY FLUID CELL COUNT WITH DIFFERENTIAL
Eos, Fluid: 1 %
Lymphs, Fluid: 77 %
Monocyte-Macrophage-Serous Fluid: 16 % — ABNORMAL LOW (ref 50–90)
Neutrophil Count, Fluid: 6 % (ref 0–25)
Total Nucleated Cell Count, Fluid: 2363 uL — ABNORMAL HIGH (ref 0–1000)

## 2023-05-29 LAB — COMPREHENSIVE METABOLIC PANEL
ALT: 35 U/L (ref 0–44)
AST: 19 U/L (ref 15–41)
Albumin: 3.4 g/dL — ABNORMAL LOW (ref 3.5–5.0)
Alkaline Phosphatase: 100 U/L (ref 38–126)
Anion gap: 8 (ref 5–15)
BUN: 13 mg/dL (ref 6–20)
CO2: 32 mmol/L (ref 22–32)
Calcium: 10.7 mg/dL — ABNORMAL HIGH (ref 8.9–10.3)
Chloride: 95 mmol/L — ABNORMAL LOW (ref 98–111)
Creatinine, Ser: 0.71 mg/dL (ref 0.44–1.00)
GFR, Estimated: >60 mL/min (ref >60)
Glucose, Bld: 135 mg/dL — ABNORMAL HIGH (ref 70–99)
Potassium: 3.6 mmol/L (ref 3.5–5.1)
Sodium: 135 mmol/L (ref 135–145)
Total Bilirubin: 0.5 mg/dL (ref 0.0–1.2)
Total Protein: 6.7 g/dL (ref 6.5–8.1)

## 2023-05-29 LAB — MAGNESIUM: Magnesium: 2.2 mg/dL (ref 1.7–2.4)

## 2023-05-29 LAB — LACTATE DEHYDROGENASE, PLEURAL OR PERITONEAL FLUID: LD, Fluid: 628 U/L — ABNORMAL HIGH (ref 3–23)

## 2023-05-29 LAB — PROTEIN, PLEURAL OR PERITONEAL FLUID: Total protein, fluid: 5.2 g/dL

## 2023-05-29 LAB — HEPARIN LEVEL (UNFRACTIONATED): Heparin Unfractionated: 0.5 [IU]/mL (ref 0.30–0.70)

## 2023-05-29 LAB — LACTATE DEHYDROGENASE: LDH: 111 U/L (ref 98–192)

## 2023-05-29 LAB — GLUCOSE, CAPILLARY: Glucose-Capillary: 184 mg/dL — ABNORMAL HIGH (ref 70–99)

## 2023-05-29 MED ORDER — POTASSIUM CHLORIDE CRYS ER 20 MEQ PO TBCR
40.0000 meq | EXTENDED_RELEASE_TABLET | Freq: Once | ORAL | Status: AC
  Administered 2023-05-29: 40 meq via ORAL
  Filled 2023-05-29: qty 2

## 2023-05-29 MED ORDER — LIDOCAINE HCL 1 % IJ SOLN
INTRAMUSCULAR | Status: AC
  Filled 2023-05-29: qty 20

## 2023-05-29 NOTE — Procedures (Signed)
 PROCEDURE SUMMARY:  Successful US guided left thoracentesis. Yielded 750 mL of clear yellow fluid. Patient tolerated procedure well. No immediate complications. EBL = trace  Specimen was sent for labs.  Post procedure chest X-ray reveals no pneumothorax  Loman Brooklyn PA-C 05/29/2023 1:30 PM

## 2023-05-29 NOTE — Progress Notes (Signed)
 Mobility Specialist - Progress Note  (RA) Pre-mobility: 93% SpO2 During mobility: 90% SpO2 Post-mobility: 92% SPO2   05/29/23 1148  Mobility  Activity Ambulated independently in hallway  Level of Assistance Independent  Assistive Device None  Distance Ambulated (ft) 500 ft  Range of Motion/Exercises Active  Activity Response Tolerated well  Mobility Referral Yes  Mobility visit 1 Mobility  Mobility Specialist Start Time (ACUTE ONLY) 1138  Mobility Specialist Stop Time (ACUTE ONLY) 1148  Mobility Specialist Time Calculation (min) (ACUTE ONLY) 10 min   Pt was found in bed and agreeable to ambulate. No complaints with session. At EOS returned to bed with all needs met. Call bell in reach and family in room.  Billey Chang Mobility Specialist

## 2023-05-29 NOTE — Progress Notes (Signed)
 GYN Oncology Progress Note  Hx:  58 year old female currently being admitted with bilateral PEs found on imaging along with ascites, pleural effusions, uterine mass. An endometrial biopsy was performed on 05/26/23 returning with highly atypical glandular cells. CA 125 at 1,092. CEA at 0.9. CA 19-9 at 73. She underwent a thoracentesis yesterday with 920 cc removed and sent to cytology for evaluation. Patient states they are planning on another thoracentesis today.   Interval: She reports feeling better today after having thoracentesis. She has been intermittently using the oxygen and feels her breathing is unlabored with O2 sat at 91-92 off O2. Has mild discomfort for thoracentesis but states this is managable. She is tolerating her diet with no nausea or emesis. Voiding frequently due to lasix without difficulty. No BM reported yesterday and states she took miralax. Partner at the bedside. No needs voiced.   Exam: Alert, oriented, in no acute distress. Breathing unlabored on RA. No lower extremity edema bilaterally. Patient currently using BSC. Additional exam deferred.   MRI pelvis on 05/26/2023: -Dominant 13.3 cm intramural fibroid in the left anterior uterine body. While uterine leiomyosarcoma cannot be excluded by imaging, the lesion does not demonstrate suspicious imaging findings by MR.  -Moderate pelvic ascites with suspected peritoneal disease/omental caking beneath the anterior abdominal wall, incompletely visualized. -Mild pelvic lymphadenopathy, suspicious for nodal metastases. -Assuming this does in fact reflect a benign fibroid, the underlying site of primary malignancy is not evident on this study.  Assessment/plan: 58 year old female currently being admitted with bilateral PEs found on imaging along with ascites, pleural effusions, uterine mass. MRI images reviewed by. Dr. Alvester Morin on 05/27/23 who feels the fibroid appears benign.  Thoracentesis cytology from 05/28/23 pending. A surgical  procedure is still considered high risk with her acute PE and would only be considered if other testing is non-diagnostic per Dr. Alvester Morin. No needs voiced pt patient at this time. Continue with current plan of care.

## 2023-05-29 NOTE — Progress Notes (Addendum)
 PROGRESS NOTE    Sharon Hensley  JYN:[id number] DOB: April 08, 1965 DOA: 05/25/2023 PCP: Gwenyth Bender, FNP    Brief Narrative:   Sharon Hensley is a 58 y.o. female with past medical history significant for hypothyroidism who presented to [facility] ED on 05/25/2023 with complaints of progressive shortness of breath, left-sided chest pain over the last 2 weeks.  Endorses shortness of breath with both rest and exercise.  She has chronic orthopnea and has been sleeping in the chair for years.  Recently started on hydrochlorothiazide due to lower extremity swelling over the last few weeks, but this was discontinued because she did not like how it made her feel.  Denies fever/chills, no abdominal pain, no nausea/vomiting.  In the ED, temperature 98.1 F, HR 109, RR 24, BP 133/93, SpO2 91% on room air.  WBC 8.7, hemoglobin 15.5, platelet count 270.  Sodium 140, potassium 3.7, chloride 104, CO2 25, glucose 175, BUN 8, creatinine 0.63.  High sensitive troponin 2> 2.  BNP 53.2.  D-dimer 11.97.  Hemoglobin A1c 6.8.  hCG negative.  TSH 6.108, free T41.06.  Influenza A/B, RSV, COVID PCR negative.  Urinalysis unrevealing.  Chest x-ray with bilateral small/moderate pleural effusions with probable underlying compressive atelectatic changes, no pneumothorax.  CT angiogram chest with bilateral segmental pulm emboli, mild/moderate clot burden with no evidence of right heart strain, moderate bilateral pleural effusions with extensive compressive atelectasis throughout the lower lobes, complete atelectasis of the right middle lobe, no central obstructing lesion identified, multiple subcentimeter mediastinal and hilar lymph nodes nonspecific, upper abdominal ascites, aortic atherosclerosis.  CT abdomen/pelvis with contrast enlarged uterus secondary to a large heterogeneous hypoattenuating area in the fundus/uterine body concern for benign versus malignant uterine mass, small/moderate ascites, soft tissue nodularity/stranding  in the upper abdomen, bilateral moderate to large pleural effusions with associated compressive atelectatic changes.  Patient was started on heparin drip. Gyn/onc was consulted given PE, ascites and uterine mass, previously thought to be benign concerning for malignancy.  TRH consulted for admission.    Assessment & Plan:   Acute hypoxic respiratory failure Patient with worsening hypoxia during hospitalization with etiology likely secondary to bilateral segmental pulmonary emboli, and pleural effusions. -- Continue supplemental oxygen, maintain SpO2 greater than 92% -- Continue treatment as below -- O2 walk screen today  Pulmonary embolism, bilaterally Patient presenting with progressive shortness of breath, left-sided chest pain.  D-dimer elevated 11.97.  CT angiogram chest with bilateral segmental pulmonary emboli with mild/moderate clot burden with no evidence of right heart strain.  TTE with LVEF 60-65%, LV with no regional wall motion abnormalities, mild concentric LVH, grade 1 diastolic dysfunction, no AR stenosis, IVC normal in size, RV systolic function normal. -- Heparin drip, pharmacy consulted for dosing/monitoring  Acute DVT Vascular duplex ultrasound bilateral lower extremities with findings consistent with acute DVT right femoral vein, right popliteal vein, right posterior tibial veins, right peroneal veins, left popliteal vein, left posterior tibial veins, left peroneal veins. -- Continue heparin drip as above  Bilateral pleural effusions Imaging notable for bilateral pleural effusions with compressive atelectasis. -- S/p right thoracentesis by IR 2/27 with 920 mL amber-colored fluid removed -- Pleural fluid cytology: Pending -- Furosemide 60 mg IV every 12 hours -- Repeat thoracentesis today  Uterine mass with ascites, pleural effusion, peritoneal disease/omental caking concerning for malignancy Patient presenting with progressive shortness of breath found to have an acute  pulmonary embolism also with associated ascites, pleural effusion on CT imaging.  Pancreas noted to be unremarkable  with no pancreatic ductal dilation or surrounding inflammatory changes on CT abdomen with contrast.  Uterine mass was previously evaluated in 2022 and thought was benign.  MR pelvis without contrast with 13.3 cm intramural fibroid left anterior uterine body, unable to exclude leiomyosarcoma, moderate pelvic ascites with suspected peritoneal disease/omental caking beneath anterior abdominal wall, mild pelvic lymphadenopathy suspicious for nodal metastasis.  Patient underwent endometrial biopsy by GYN/oncology 05/26/2023.  Patient reports family history of colon cancer and renal cell carcinoma, denies any previous colonoscopies but has undergone Cologuard testing that has been reported negative -- GYN/Onc following; appreciate assistance -- CEA 0.9, wNL -- CA 19-9 elevated 73 -- CA 125 elevated 1092.0 -- Pathology from EMB: Highly atypical glandular cells -- Further per GYN/oncology, may end up needing diagnostic laparoscopy for definitive diagnosis  Hypothyroidism TSH 6.108, free T41.06. -- Continue home levothyroxine 150 mcg p.o. daily -- Repeat TFTs in 4-6 weeks  DVT prophylaxis: Heparin drip    Code Status: Full Code Family Communication: Patient's partner who is present at bedside this morning  Disposition Plan:  Level of care: Telemetry Status is: Inpatient Remains inpatient appropriate because: IV diuresis, further workup for concern of underlying malignancy; repeat thoracentesis today, needs weaning from supplemental oxygen    Consultants:  GYN/Onc  Procedures:  Vascular duplex ultrasound bilateral lower extremities TTE Right thoracentesis, IR 2/27  Antimicrobials:  None   Subjective: Patient seen examined bedside, doing upright in bed.  Partner present at bedside.  Reports some soreness surrounding thoracentesis site from yesterday.  Dyspnea improved.  Seen by  GYN/oncology this morning with endometrial biopsy showing atypical glandular cells.  Still unclear regarding possible malignancy diagnosis.  Awaiting cytology from pleural effusion yesterday, plan to repeat thoracentesis today.  Off of oxygen this morning, 91% at rest.   No other specific questions, concerns or complaints at this time.  Denies headache, no visual changes, no chest pain, no palpitations, no nausea/vomiting/diarrhea, no fever/chills/night sweats, no abdominal pain, no focal weakness, no fatigue, no paresthesias.  No acute events overnight per nursing staff.  Objective: Vitals:   05/28/23 1625 05/28/23 2042 05/29/23 0534 05/29/23 0930  BP: 122/80 129/87 132/83 (!) 149/93  Pulse: (!) 107 99 82 100  Resp: 20 18 20 18   Temp: 97.9 F (36.6 C) 98.2 F (36.8 C) 97.8 F (36.6 C) 98.2 F (36.8 C)  TempSrc: Oral Oral Oral Oral  SpO2: 95% 94% 97% 91%  Weight:   90.8 kg   Height:        Intake/Output Summary (Last 24 hours) at 05/29/2023 1201 Last data filed at 05/29/2023 6644 Gross per 24 hour  Intake 1164.06 ml  Output 1700 ml  Net -535.94 ml   Filed Weights   05/27/23 0500 05/28/23 0500 05/29/23 0534  Weight: 91.9 kg 91.3 kg 90.8 kg    Examination:  Physical Exam: GEN: NAD, alert and oriented x 3, obese HEENT: NCAT, PERRL, EOMI, sclera clear, MMM PULM: Breath sounds diminished bilateral bases, no wheezing/crackles, normal respiratory effort without accessory muscle use, on room air at rest with SpO2 91% CV: RRR w/o M/G/R GI: abd soft, NTND, NABS, no R/G/M MSK: + Trace lower extremity peripheral edema, muscle strength globally intact 5/5 bilateral upper/lower extremities NEURO: CN II-XII intact, no focal deficits, sensation to light touch intact PSYCH: normal mood/affect Integumentary: dry/intact, no rashes or wounds    Data Reviewed: I have personally reviewed following labs and imaging studies  CBC: Recent Labs  Lab 05/25/23 1107 05/26/23 0525 05/27/23 0455  05/28/23 0521 05/29/23 0438  WBC 8.7 8.3 8.8 8.2 8.3  HGB 15.5* 14.1 14.1 15.9* 14.9  HCT 49.1* 44.0 45.5 50.9* 46.9*  MCV 91.8 91.3 95.0 92.4 91.4  PLT 270 272 270 339 293   Basic Metabolic Panel: Recent Labs  Lab 05/25/23 1107 05/26/23 0525 05/27/23 0455 05/29/23 0438  NA 140 143 139 135  K 3.7 3.7 3.7 3.6  CL 104 104 100 95*  CO2 25 28 27  32  GLUCOSE 175* 152* 135* 135*  BUN 8 9 11 13   CREATININE 0.63 0.68 0.65 0.71  CALCIUM 10.2 10.3 9.9 10.7*  MG  --   --  2.2 2.2  PHOS  --   --  3.1  --    GFR: Estimated Creatinine Clearance: 84.6 mL/min (by C-G formula based on SCr of 0.71 mg/dL). Liver Function Tests: Recent Labs  Lab 05/25/23 1341 05/26/23 0525 05/29/23 0438  AST 27 19 19   ALT 29 27 35  ALKPHOS 105 102 100  BILITOT 0.6 0.5 0.5  PROT 7.3 6.8 6.7  ALBUMIN 3.9 3.6 3.4*   No results for input(s): "LIPASE", "AMYLASE" in the last 168 hours. No results for input(s): "AMMONIA" in the last 168 hours. Coagulation Profile: No results for input(s): "INR", "PROTIME" in the last 168 hours. Cardiac Enzymes: No results for input(s): "CKTOTAL", "CKMB", "CKMBINDEX", "TROPONINI" in the last 168 hours. BNP (last 3 results) No results for input(s): "PROBNP" in the last 8760 hours. HbA1C: No results for input(s): "HGBA1C" in the last 72 hours.  CBG: Recent Labs  Lab 05/29/23 1101  GLUCAP 184*   Lipid Profile: No results for input(s): "CHOL", "HDL", "LDLCALC", "TRIG", "CHOLHDL", "LDLDIRECT" in the last 72 hours. Thyroid Function Tests: No results for input(s): "TSH", "T4TOTAL", "FREET4", "T3FREE", "THYROIDAB" in the last 72 hours.  Anemia Panel: No results for input(s): "VITAMINB12", "FOLATE", "FERRITIN", "TIBC", "IRON", "RETICCTPCT" in the last 72 hours. Sepsis Labs: No results for input(s): "PROCALCITON", "LATICACIDVEN" in the last 168 hours.  Recent Results (from the past 240 hours)  Resp panel by RT-PCR (RSV, Flu A&B, Covid) Anterior Nasal Swab     Status:  None   Collection Time: 05/25/23 12:12 PM   Specimen: Anterior Nasal Swab  Result Value Ref Range Status   SARS Coronavirus 2 by RT PCR NEGATIVE NEGATIVE Final    Comment: (NOTE) SARS-CoV-2 target nucleic acids are NOT DETECTED.  The SARS-CoV-2 RNA is generally detectable in upper respiratory specimens during the acute phase of infection. The lowest concentration of SARS-CoV-2 viral copies this assay can detect is 138 copies/mL. A negative result does not preclude SARS-Cov-2 infection and should not be used as the sole basis for treatment or other patient management decisions. A negative result may occur with  improper specimen collection/handling, submission of specimen other than nasopharyngeal swab, presence of viral mutation(s) within the areas targeted by this assay, and inadequate number of viral copies(<138 copies/mL). A negative result must be combined with clinical observations, patient history, and epidemiological information. The expected result is Negative.  Fact Sheet for Patients:  BloggerCourse.com  Fact Sheet for Healthcare Providers:  SeriousBroker.it  This test is no t yet approved or cleared by the [place] FDA and  has been authorized for detection and/or diagnosis of SARS-CoV-2 by FDA under an Emergency Use Authorization (EUA). This EUA will remain  in effect (meaning this test can be used) for the duration of the COVID-19 declaration under Section 564(b)(1) of the Act, 21 U.S.C.section 360bbb-3(b)(1), unless the authorization is  terminated  or revoked sooner.       Influenza A by PCR NEGATIVE NEGATIVE Final   Influenza B by PCR NEGATIVE NEGATIVE Final    Comment: (NOTE) The Xpert Xpress SARS-CoV-2/FLU/RSV plus assay is intended as an aid in the diagnosis of influenza from Nasopharyngeal swab specimens and should not be used as a sole basis for treatment. Nasal washings and aspirates are unacceptable  for Xpert Xpress SARS-CoV-2/FLU/RSV testing.  Fact Sheet for Patients: BloggerCourse.com  Fact Sheet for Healthcare Providers: SeriousBroker.it  This test is not yet approved or cleared by the [place] FDA and has been authorized for detection and/or diagnosis of SARS-CoV-2 by FDA under an Emergency Use Authorization (EUA). This EUA will remain in effect (meaning this test can be used) for the duration of the COVID-19 declaration under Section 564(b)(1) of the Act, 21 U.S.C. section 360bbb-3(b)(1), unless the authorization is terminated or revoked.     Resp Syncytial Virus by PCR NEGATIVE NEGATIVE Final    Comment: (NOTE) Fact Sheet for Patients: BloggerCourse.com  Fact Sheet for Healthcare Providers: SeriousBroker.it  This test is not yet approved or cleared by the [place] FDA and has been authorized for detection and/or diagnosis of SARS-CoV-2 by FDA under an Emergency Use Authorization (EUA). This EUA will remain in effect (meaning this test can be used) for the duration of the COVID-19 declaration under Section 564(b)(1) of the Act, 21 U.S.C. section 360bbb-3(b)(1), unless the authorization is terminated or revoked.  Performed at [facility], [address] [place], [place] [address]   Body fluid culture w Gram Stain     Status: None (Preliminary result)   Collection Time: 05/28/23 11:12 AM   Specimen: PATH Cytology Pleural fluid  Result Value Ref Range Status   Specimen Description   Final    PLEURAL Performed at [facility], [address], [place], [place] [address]    Special Requests   Final    NONE Performed at [facility], [address], [address], [place] [address]    Gram Stain   Final    FEW WBC PRESENT, PREDOMINANTLY MONONUCLEAR NO ORGANISMS SEEN    Culture   Final    NO GROWTH < 24  HOURS Performed at [facility], [address] [place], [place] [address]    Report Status PENDING  Incomplete         Radiology Studies: DG CHEST PORT 1 VIEW Result Date: 05/28/2023 CLINICAL DATA:  Dyspnea, bilateral pulmonary emboli EXAM: PORTABLE CHEST 1 VIEW COMPARISON:  05/28/2023 FINDINGS: Single frontal view of the chest demonstrates a stable cardiac silhouette. There are progressive bilateral veiling opacities, left greater than right. No pneumothorax. No acute bony abnormalities. IMPRESSION: 1. Progressive bibasilar veiling opacities consistent with worsening bibasilar consolidation and enlarging effusions. Electronically Signed   By: Sharlet Salina M.D.   On: 05/28/2023 20:41   DG Chest 1 View Result Date: 05/28/2023 CLINICAL DATA:  Status post right thoracentesis. EXAM: CHEST  1 VIEW COMPARISON:  Chest radiographs 05/28/2023, 224 2025 FINDINGS: There is improvement in the prior layering right pleural effusion. Unchanged mild-to-moderate left pleural effusion. Thickening of right minor fissure from tracking fluid is similar to prior. No pneumothorax is seen. The visualized cardiac silhouette and mediastinal contours are within normal limits. Moderate multilevel degenerative disc changes of the thoracic spine. IMPRESSION: 1. Improvement in the prior layering right pleural effusion following interval thoracentesis. No pneumothorax. 2. Unchanged mild-to-moderate left pleural effusion. Electronically Signed  By: Neita Garnet M.D.   On: 05/28/2023 14:50   [place] ASCITES (ABDOMEN LIMITED) Result Date: 05/28/2023 INDICATION: 58 year old female with uterine mass concerning for malignancy with ascites for diagnostic paracentesis. EXAM: ULTRASOUND ABDOMEN LIMITED FINDINGS: Imaging of all 4 quadrants of the abdomen reveal trace ascites IMPRESSION: Small amount of ascites seen on ultrasound. After discussion of the risks versus benefits of the procedure the patient decided to defer paracentesis  at this time. Performed By Theresa Mulligan, PA-C Electronically Signed   By: Gilmer Mor D.O.   On: 05/28/2023 14:31   US THORACENTESIS ASP PLEURAL SPACE W/IMG GUIDE Result Date: 05/28/2023 INDICATION: 58 year old female with uterine mass concerning for maliganancy, pulmonary embolus, bilateral pleural effusion for right therapeautic and diagnostic thoracentesis. EXAM: ULTRASOUND GUIDED RIGHT THORACENTESIS MEDICATIONS: 12 mL 1% lidocaine COMPLICATIONS: None immediate. PROCEDURE: An ultrasound guided thoracentesis was thoroughly discussed with the patient and questions answered. The benefits, risks, alternatives and complications were also discussed. The patient understands and wishes to proceed with the procedure. Written consent was obtained. Ultrasound was performed to localize and mark an adequate pocket of fluid in the right chest. The area was then prepped and draped in the normal sterile fashion. 1% Lidocaine was used for local anesthesia. Under ultrasound guidance a 8 Fr Safe-T-Centesis catheter was introduced. Thoracentesis was performed. The catheter was removed and a dressing applied. FINDINGS: A total of approximately 920 mL of clear red fluid was removed. Samples were sent to the laboratory as requested by the clinical team. IMPRESSION: Successful ultrasound guided right thoracentesis yielding 920 mL of pleural fluid. Performed By Theresa Mulligan, PA-C Electronically Signed   By: Gilmer Mor D.O.   On: 05/28/2023 14:31   DG Chest 2 View Result Date: 05/28/2023 CLINICAL DATA:  Pleural effusion. EXAM: CHEST - 2 VIEW COMPARISON:  May 25, 2023. FINDINGS: Stable cardiomediastinal silhouette. Stable bilateral pleural effusions with associated bibasilar atelectasis. Bony thorax is unremarkable. IMPRESSION: Stable bilateral pleural effusions with associated bibasilar atelectasis. Electronically Signed   By: Lupita Raider M.D.   On: 05/28/2023 09:50        Scheduled Meds:  furosemide  60  mg Intravenous BID   levothyroxine  150 mcg Oral QAC breakfast   polyethylene glycol  17 g Oral BID   Continuous Infusions:  heparin 1,900 Units/hr (05/29/23 0920)     LOS: 4 days    Time spent: 52 minutes spent on chart review, discussion with nursing staff, consultants, updating family and interview/physical exam; more than 50% of that time was spent in counseling and/or coordination of care.    Alvira Philips [place], DO [facility] Available via Epic secure chat 7am-7pm After these hours, please refer to coverage provider listed on [email or web address] 05/29/2023, 12:01 PM

## 2023-05-29 NOTE — Progress Notes (Signed)
 PHARMACY - ANTICOAGULATION CONSULT NOTE  Pharmacy Consult for IV Heparin Indication: pulmonary embolus  Allergies  Allergen Reactions   Clindamycin/Lincomycin Anaphylaxis   Hydrochlorothiazide Other (See Comments)    "Made me feel like cement"- patient does not wish to take this again   Penicillins Hives, Itching and Other (See Comments)    Patient said she can tolerate Amoxicillin   Tape Rash    Patient Measurements: Height: 5\' 4"  (162.6 cm) Weight: 90.8 kg (200 lb 1.6 oz) IBW/kg (Calculated) : 54.7 Heparin dosing weight: 76 kg  Vital Signs: Temp: 97.8 F (36.6 C) (02/28 0534) Temp Source: Oral (02/28 0534) BP: 132/83 (02/28 0534) Pulse Rate: 82 (02/28 0534)  Labs: Recent Labs    05/27/23 0455 05/27/23 1226 05/28/23 0521 05/28/23 1238 05/29/23 0438  HGB 14.1  --  15.9*  --  14.9  HCT 45.5  --  50.9*  --  46.9*  PLT 270  --  339  --  293  HEPARINUNFRC 0.25*   < > 0.50 0.45 0.50  CREATININE 0.65  --   --   --  0.71   < > = values in this interval not displayed.    Estimated Creatinine Clearance: 84.6 mL/min (by C-G formula based on SCr of 0.71 mg/dL).  Assessment: 61 yoF presents with worsening SOB/chest pain; found to have bilateral segmental PEs (no RH strain noted). Pharmacy consulted to dose IV heparin  Today, 05/29/2023: Daily heparin level continues to be therapeutic after rate increase to 1900 units/hr on 2/27 CBC stable SCr stable < 1.0 (baseline) No s/sx of bleeding or infusion issues noted  Goal of Therapy:  Heparin level 0.3-0.7 Monitor platelets by anticoagulation protocol: Yes   Plan:  Continue heparin infusion at 1900 units/hr  Daily heparin level and CBC Monitor for s/sx of bleeding daily    Hessie Knows, PharmD, BCPS Secure Chat if ?s 05/29/2023 8:45 AM

## 2023-05-30 DIAGNOSIS — I2699 Other pulmonary embolism without acute cor pulmonale: Secondary | ICD-10-CM | POA: Diagnosis not present

## 2023-05-30 LAB — BASIC METABOLIC PANEL
Anion gap: 8 (ref 5–15)
BUN: 13 mg/dL (ref 6–20)
CO2: 33 mmol/L — ABNORMAL HIGH (ref 22–32)
Calcium: 10.7 mg/dL — ABNORMAL HIGH (ref 8.9–10.3)
Chloride: 95 mmol/L — ABNORMAL LOW (ref 98–111)
Creatinine, Ser: 0.69 mg/dL (ref 0.44–1.00)
GFR, Estimated: >60 mL/min (ref >60)
Glucose, Bld: 132 mg/dL — ABNORMAL HIGH (ref 70–99)
Potassium: 4 mmol/L (ref 3.5–5.1)
Sodium: 136 mmol/L (ref 135–145)

## 2023-05-30 LAB — HEPARIN LEVEL (UNFRACTIONATED): Heparin Unfractionated: 0.39 [IU]/mL (ref 0.30–0.70)

## 2023-05-30 LAB — MAGNESIUM: Magnesium: 2.5 mg/dL — ABNORMAL HIGH (ref 1.7–2.4)

## 2023-05-30 NOTE — Progress Notes (Signed)
 GYN Oncology Progress Note  Hx:  58 year old female currently being admitted with bilateral PEs found on imaging along with ascites, pleural effusions, uterine mass. An endometrial biopsy was performed on 05/26/23 returning with highly atypical glandular cells. CA 125 at 1,092. CEA at 0.9. CA 19-9 at 73. She underwent a thoracentesis on 2/27 and 2/28. Cytology pending (not yet in process).  Interval: Patient reports that she is overall feeling well.  Has been doing well off of oxygen.  Does feel some soreness where she has undergone her thoracenteses but overall pain well-controlled.  Tolerating regular diet without nausea or emesis.  Reports regular bowel movement.  Voiding without issue.  No ongoing vaginal bleeding following endometrial biopsy.   Exam: BP 137/87 (BP Location: Right Arm)   Pulse 91   Temp 97.7 F (36.5 C) (Oral)   Resp (!) 22   Ht 5\' 4"  (1.626 m)   Wt 200 lb 9.9 oz (91 kg)   LMP 10/05/2016   SpO2 94%   BMI 34.44 kg/m  Gen: Alert, oriented, in no acute distress.  Pulm: Breathing unlabored on RA. Decreased lung sounds in bases otherwise CTAB. CV: RRR Abdomen: Normal BS. Soft, nontender Extremities: No edema in bilateral lower extremities  MRI pelvis on 05/26/2023: -Dominant 13.3 cm intramural fibroid in the left anterior uterine body. While uterine leiomyosarcoma cannot be excluded by imaging, the lesion does not demonstrate suspicious imaging findings by MR.  -Moderate pelvic ascites with suspected peritoneal disease/omental caking beneath the anterior abdominal wall, incompletely visualized. -Mild pelvic lymphadenopathy, suspicious for nodal metastases. -Assuming this does in fact reflect a benign fibroid, the underlying site of primary malignancy is not evident on this study.  Assessment: 58 year old female currently being admitted with bilateral PEs found on imaging along with ascites, pleural effusions, uterine mass, possible carcinomatosis. MRI 05/26/23 with  uterine mass that appears consistent with a fibroid. Left ovary not visualized. Normal appearing right ovary. Moderate ascites and possible peritoneal disease. Elevated CA124 and CA19-9. Has undergone two thoracenteses (2/27, 2/28). Endometrial biopsy with atypical cells but not able to be further characterized. Cytology pending (not yet in process).   Recommendations: - Will follow-up cytology results - If pt otherwise clinically stable for discharge, from my perspective, okay to discharge with anticoagulation and we will follow-up results as an outpatient - If cytology is benign, can consider repeat endometrial biopsy in outpatient setting - Would recommend holding off on surgical intervention at this time given acute PE. If work-up does not yield diagnosis, can consider diagnostic laparoscopy but will need additional planning to safely perform. Does not need to stay inpatient for this.

## 2023-05-30 NOTE — Progress Notes (Signed)
 PROGRESS NOTE    Sharon Hensley  MVH:[id number] DOB: 02-04-1966 DOA: 05/25/2023 PCP: Gwenyth Bender, FNP    Brief Narrative:   Sharon Hensley is a 58 y.o. female with past medical history significant for hypothyroidism who presented to [facility] ED on 05/25/2023 with complaints of progressive shortness of breath, left-sided chest pain over the last 2 weeks.  Endorses shortness of breath with both rest and exercise.  She has chronic orthopnea and has been sleeping in the chair for years.  Recently started on hydrochlorothiazide due to lower extremity swelling over the last few weeks, but this was discontinued because she did not like how it made her feel.  Denies fever/chills, no abdominal pain, no nausea/vomiting.  In the ED, temperature 98.1 F, HR 109, RR 24, BP 133/93, SpO2 91% on room air.  WBC 8.7, hemoglobin 15.5, platelet count 270.  Sodium 140, potassium 3.7, chloride 104, CO2 25, glucose 175, BUN 8, creatinine 0.63.  High sensitive troponin 2> 2.  BNP 53.2.  D-dimer 11.97.  Hemoglobin A1c 6.8.  hCG negative.  TSH 6.108, free T41.06.  Influenza A/B, RSV, COVID PCR negative.  Urinalysis unrevealing.  Chest x-ray with bilateral small/moderate pleural effusions with probable underlying compressive atelectatic changes, no pneumothorax.  CT angiogram chest with bilateral segmental pulm emboli, mild/moderate clot burden with no evidence of right heart strain, moderate bilateral pleural effusions with extensive compressive atelectasis throughout the lower lobes, complete atelectasis of the right middle lobe, no central obstructing lesion identified, multiple subcentimeter mediastinal and hilar lymph nodes nonspecific, upper abdominal ascites, aortic atherosclerosis.  CT abdomen/pelvis with contrast enlarged uterus secondary to a large heterogeneous hypoattenuating area in the fundus/uterine body concern for benign versus malignant uterine mass, small/moderate ascites, soft tissue nodularity/stranding  in the upper abdomen, bilateral moderate to large pleural effusions with associated compressive atelectatic changes.  Patient was started on heparin drip. Gyn/onc was consulted given PE, ascites and uterine mass, previously thought to be benign concerning for malignancy.  TRH consulted for admission.    Assessment & Plan:   Acute hypoxic respiratory failure Patient with worsening hypoxia during hospitalization with etiology likely secondary to bilateral segmental pulmonary emboli, and pleural effusions. -- Continue supplemental oxygen, maintain SpO2 greater than 92%; O2 now titrated off -- Continue treatment as below  Pulmonary embolism, bilaterally Patient presenting with progressive shortness of breath, left-sided chest pain.  D-dimer elevated 11.97.  CT angiogram chest with bilateral segmental pulmonary emboli with mild/moderate clot burden with no evidence of right heart strain.  TTE with LVEF 60-65%, LV with no regional wall motion abnormalities, mild concentric LVH, grade 1 diastolic dysfunction, no AR stenosis, IVC normal in size, RV systolic function normal. -- Heparin drip, pharmacy consulted for dosing/monitoring --Likely anticipate transition to Eliquis tomorrow if CXR stable   Acute DVT Vascular duplex ultrasound bilateral lower extremities with findings consistent with acute DVT right femoral vein, right popliteal vein, right posterior tibial veins, right peroneal veins, left popliteal vein, left posterior tibial veins, left peroneal veins. -- Continue heparin drip as above; likely transition to Eliquis tomorrow as above  Bilateral pleural effusions Imaging notable for bilateral pleural effusions with compressive atelectasis. -- S/p right thoracentesis by IR 2/27 with 920 mL amber-colored fluid removed -- s/p left thoracentesis by IR 2/28 with 750 mL fluid removed -- Pleural fluid cytology x 2: Pending -- Furosemide 60 mg IV every 12 hours -- Repeat CXR in the am  Uterine  mass with ascites, pleural effusion, peritoneal disease/omental caking  concerning for malignancy Patient presenting with progressive shortness of breath found to have an acute pulmonary embolism also with associated ascites, pleural effusion on CT imaging.  Pancreas noted to be unremarkable with no pancreatic ductal dilation or surrounding inflammatory changes on CT abdomen with contrast.  Uterine mass was previously evaluated in 2022 and thought was benign.  MR pelvis without contrast with 13.3 cm intramural fibroid left anterior uterine body, unable to exclude leiomyosarcoma, moderate pelvic ascites with suspected peritoneal disease/omental caking beneath anterior abdominal wall, mild pelvic lymphadenopathy suspicious for nodal metastasis.  Patient underwent endometrial biopsy by GYN/oncology 05/26/2023.  Patient reports family history of colon cancer and renal cell carcinoma, denies any previous colonoscopies but has undergone Cologuard testing that has been reported negative -- GYN/Onc following; appreciate assistance -- CEA 0.9, wNL -- CA 19-9 elevated 73 -- CA 125 elevated 1092.0 -- Pathology from EMB: Highly atypical glandular cells -- Further per GYN/oncology, may end up needing diagnostic laparoscopy for definitive diagnosis but per GYN/ONC too high risk given PE; deferring further management to outpatient setting  Hypothyroidism TSH 6.108, free T41.06. -- Continue home levothyroxine 150 mcg p.o. daily -- Repeat TFTs in 4-6 weeks  DVT prophylaxis: Heparin drip    Code Status: Full Code Family Communication: Patient's partner who is present at bedside this morning  Disposition Plan:  Level of care: Telemetry Status is: Inpatient Remains inpatient appropriate because: IV diuresis, further workup for concern of underlying malignancy; repeat chest x-ray in the a.m.  If stable anticipate discharge home on Eliquis tomorrow.    Consultants:  GYN/Onc  Procedures:  Vascular duplex  ultrasound bilateral lower extremities TTE Right thoracentesis, IR 2/27  Antimicrobials:  None   Subjective: Patient seen examined bedside, walking around the room, just went to the restroom.  Significant other present.  Seen by GYN/oncology this morning.  Oxygen titrated off.  Discussed with patient that oncology plans to defer further workup into the outpatient setting as we still await cytology results.  Patient with no specific complaints, concerns or questions at this time. Denies headache, no visual changes, no chest pain, no palpitations, no nausea/vomiting/diarrhea, no fever/chills/night sweats, no abdominal pain, no focal weakness, no fatigue, no paresthesias.  No acute events overnight per nursing staff.  Objective: Vitals:   05/29/23 2026 05/30/23 0500 05/30/23 0530 05/30/23 1100  BP: 121/88  137/87 (!) 132/100  Pulse: 98  91 98  Resp: 18  (!) 22 20  Temp: 98.3 F (36.8 C)  97.7 F (36.5 C) 99.3 F (37.4 C)  TempSrc: Oral  Oral Oral  SpO2: 91%  94% 94%  Weight:  91 kg    Height:        Intake/Output Summary (Last 24 hours) at 05/30/2023 1404 Last data filed at 05/30/2023 0904 Gross per 24 hour  Intake 680.56 ml  Output 2500 ml  Net -1819.44 ml   Filed Weights   05/28/23 0500 05/29/23 0534 05/30/23 0500  Weight: 91.3 kg 90.8 kg 91 kg    Examination:  Physical Exam: GEN: NAD, alert and oriented x 3, obese HEENT: NCAT, PERRL, EOMI, sclera clear, MMM PULM: Breath sounds diminished bilateral bases, no wheezing/crackles, normal respiratory effort without accessory muscle use, on room air CV: RRR w/o M/G/R GI: abd soft, NTND, NABS, no R/G/M MSK: + Trace lower extremity peripheral edema, muscle strength globally intact 5/5 bilateral upper/lower extremities NEURO: CN II-XII intact, no focal deficits, sensation to light touch intact PSYCH: normal mood/affect Integumentary: dry/intact, no rashes or wounds  Data Reviewed: I have personally reviewed following labs  and imaging studies  CBC: Recent Labs  Lab 05/25/23 1107 05/26/23 0525 05/27/23 0455 05/28/23 0521 05/29/23 0438  WBC 8.7 8.3 8.8 8.2 8.3  HGB 15.5* 14.1 14.1 15.9* 14.9  HCT 49.1* 44.0 45.5 50.9* 46.9*  MCV 91.8 91.3 95.0 92.4 91.4  PLT 270 272 270 339 293   Basic Metabolic Panel: Recent Labs  Lab 05/25/23 1107 05/26/23 0525 05/27/23 0455 05/29/23 0438 05/30/23 0629  NA 140 143 139 135 136  K 3.7 3.7 3.7 3.6 4.0  CL 104 104 100 95* 95*  CO2 25 28 27  32 33*  GLUCOSE 175* 152* 135* 135* 132*  BUN 8 9 11 13 13   CREATININE 0.63 0.68 0.65 0.71 0.69  CALCIUM 10.2 10.3 9.9 10.7* 10.7*  MG  --   --  2.2 2.2 2.5*  PHOS  --   --  3.1  --   --    GFR: Estimated Creatinine Clearance: 84.8 mL/min (by C-G formula based on SCr of 0.69 mg/dL). Liver Function Tests: Recent Labs  Lab 05/25/23 1341 05/26/23 0525 05/29/23 0438  AST 27 19 19   ALT 29 27 35  ALKPHOS 105 102 100  BILITOT 0.6 0.5 0.5  PROT 7.3 6.8 6.7  ALBUMIN 3.9 3.6 3.4*   No results for input(s): "LIPASE", "AMYLASE" in the last 168 hours. No results for input(s): "AMMONIA" in the last 168 hours. Coagulation Profile: No results for input(s): "INR", "PROTIME" in the last 168 hours. Cardiac Enzymes: No results for input(s): "CKTOTAL", "CKMB", "CKMBINDEX", "TROPONINI" in the last 168 hours. BNP (last 3 results) No results for input(s): "PROBNP" in the last 8760 hours. HbA1C: No results for input(s): "HGBA1C" in the last 72 hours.  CBG: Recent Labs  Lab 05/29/23 1101  GLUCAP 184*   Lipid Profile: No results for input(s): "CHOL", "HDL", "LDLCALC", "TRIG", "CHOLHDL", "LDLDIRECT" in the last 72 hours. Thyroid Function Tests: No results for input(s): "TSH", "T4TOTAL", "FREET4", "T3FREE", "THYROIDAB" in the last 72 hours.  Anemia Panel: No results for input(s): "VITAMINB12", "FOLATE", "FERRITIN", "TIBC", "IRON", "RETICCTPCT" in the last 72 hours. Sepsis Labs: No results for input(s): "PROCALCITON",  "LATICACIDVEN" in the last 168 hours.  Recent Results (from the past 240 hours)  Resp panel by RT-PCR (RSV, Flu A&B, Covid) Anterior Nasal Swab     Status: None   Collection Time: 05/25/23 12:12 PM   Specimen: Anterior Nasal Swab  Result Value Ref Range Status   SARS Coronavirus 2 by RT PCR NEGATIVE NEGATIVE Final    Comment: (NOTE) SARS-CoV-2 target nucleic acids are NOT DETECTED.  The SARS-CoV-2 RNA is generally detectable in upper respiratory specimens during the acute phase of infection. The lowest concentration of SARS-CoV-2 viral copies this assay can detect is 138 copies/mL. A negative result does not preclude SARS-Cov-2 infection and should not be used as the sole basis for treatment or other patient management decisions. A negative result may occur with  improper specimen collection/handling, submission of specimen other than nasopharyngeal swab, presence of viral mutation(s) within the areas targeted by this assay, and inadequate number of viral copies(<138 copies/mL). A negative result must be combined with clinical observations, patient history, and epidemiological information. The expected result is Negative.  Fact Sheet for Patients:  BloggerCourse.com  Fact Sheet for Healthcare Providers:  SeriousBroker.it  This test is no t yet approved or cleared by the [place] FDA and  has been authorized for detection and/or diagnosis of SARS-CoV-2 by FDA under  an Emergency Use Authorization (EUA). This EUA will remain  in effect (meaning this test can be used) for the duration of the COVID-19 declaration under Section 564(b)(1) of the Act, 21 U.S.C.section 360bbb-3(b)(1), unless the authorization is terminated  or revoked sooner.       Influenza A by PCR NEGATIVE NEGATIVE Final   Influenza B by PCR NEGATIVE NEGATIVE Final    Comment: (NOTE) The Xpert Xpress SARS-CoV-2/FLU/RSV plus assay is intended as an aid in the  diagnosis of influenza from Nasopharyngeal swab specimens and should not be used as a sole basis for treatment. Nasal washings and aspirates are unacceptable for Xpert Xpress SARS-CoV-2/FLU/RSV testing.  Fact Sheet for Patients: BloggerCourse.com  Fact Sheet for Healthcare Providers: SeriousBroker.it  This test is not yet approved or cleared by the [place] FDA and has been authorized for detection and/or diagnosis of SARS-CoV-2 by FDA under an Emergency Use Authorization (EUA). This EUA will remain in effect (meaning this test can be used) for the duration of the COVID-19 declaration under Section 564(b)(1) of the Act, 21 U.S.C. section 360bbb-3(b)(1), unless the authorization is terminated or revoked.     Resp Syncytial Virus by PCR NEGATIVE NEGATIVE Final    Comment: (NOTE) Fact Sheet for Patients: BloggerCourse.com  Fact Sheet for Healthcare Providers: SeriousBroker.it  This test is not yet approved or cleared by the [place] FDA and has been authorized for detection and/or diagnosis of SARS-CoV-2 by FDA under an Emergency Use Authorization (EUA). This EUA will remain in effect (meaning this test can be used) for the duration of the COVID-19 declaration under Section 564(b)(1) of the Act, 21 U.S.C. section 360bbb-3(b)(1), unless the authorization is terminated or revoked.  Performed at [facility], [address] [place], [place] [address]   Body fluid culture w Gram Stain     Status: None (Preliminary result)   Collection Time: 05/28/23 11:12 AM   Specimen: PATH Cytology Pleural fluid  Result Value Ref Range Status   Specimen Description   Final    PLEURAL Performed at [facility], [address], [place], [place] [address]    Special Requests   Final    NONE Performed at [facility], [address] [place], [place] [address]    Gram Stain   Final    FEW WBC PRESENT, PREDOMINANTLY MONONUCLEAR NO ORGANISMS SEEN    Culture   Final    NO GROWTH 2 DAYS Performed at [facility], [address] [place], [place] [address]    Report Status PENDING  Incomplete         Radiology Studies: US THORACENTESIS ASP PLEURAL SPACE W/IMG GUIDE Result Date: 05/29/2023 INDICATION: 58 year old female with uterine mass concerning for malignancy, pulmonary embolus, bilateral pleural effusion for left therapeutic and diagnostic thoracentesis. EXAM: ULTRASOUND GUIDED LEFT THORACENTESIS MEDICATIONS: 10 mL 1% lidocaine COMPLICATIONS: None immediate. PROCEDURE: An ultrasound guided thoracentesis was thoroughly discussed with the patient and questions answered. The benefits, risks, alternatives and complications were also discussed. The patient understands and wishes to proceed with the procedure. Written consent was obtained. Ultrasound was performed to localize and mark an adequate pocket of fluid in the left chest. The area was then prepped and draped in the normal sterile fashion. 1% Lidocaine was used for local anesthesia. Under ultrasound guidance a 8 Fr Safe-T-Centesis catheter was introduced. Thoracentesis was performed. The catheter was removed and a dressing applied. FINDINGS: A total of approximately 750 mL  of clear yellow fluid was removed. Samples were sent to the laboratory as requested by the clinical team. IMPRESSION: Successful ultrasound guided left thoracentesis yielding 750 mL of pleural fluid. Performed By Theresa Mulligan, PA-C Electronically Signed   By: Gilmer Mor D.O.   On: 05/29/2023 15:18   DG Chest 1 View Result Date: 05/29/2023 CLINICAL DATA:  Left thoracentesis EXAM: CHEST  1 VIEW COMPARISON:  05/28/2023 FINDINGS: Small bilateral pleural effusions with associated bibasilar opacities. Left-sided effusion has decreased following thoracentesis. No pneumothorax. Stable heart  size. IMPRESSION: Small bilateral pleural effusions with associated bibasilar opacities. Left-sided effusion has decreased following thoracentesis. No pneumothorax. Electronically Signed   By: Duanne Guess D.O.   On: 05/29/2023 15:00   DG CHEST PORT 1 VIEW Result Date: 05/28/2023 CLINICAL DATA:  Dyspnea, bilateral pulmonary emboli EXAM: PORTABLE CHEST 1 VIEW COMPARISON:  05/28/2023 FINDINGS: Single frontal view of the chest demonstrates a stable cardiac silhouette. There are progressive bilateral veiling opacities, left greater than right. No pneumothorax. No acute bony abnormalities. IMPRESSION: 1. Progressive bibasilar veiling opacities consistent with worsening bibasilar consolidation and enlarging effusions. Electronically Signed   By: Sharlet Salina M.D.   On: 05/28/2023 20:41        Scheduled Meds:  furosemide  60 mg Intravenous BID   levothyroxine  150 mcg Oral QAC breakfast   polyethylene glycol  17 g Oral BID   Continuous Infusions:  heparin 1,900 Units/hr (05/30/23 1338)     LOS: 5 days    Time spent: 52 minutes spent on chart review, discussion with nursing staff, consultants, updating family and interview/physical exam; more than 50% of that time was spent in counseling and/or coordination of care.    Alvira Philips [place], DO [facility] Available via Epic secure chat 7am-7pm After these hours, please refer to coverage provider listed on [email or web address] 05/30/2023, 2:04 PM

## 2023-05-30 NOTE — Progress Notes (Signed)
 PHARMACY - ANTICOAGULATION CONSULT NOTE  Pharmacy Consult for IV Heparin Indication: pulmonary embolus  Allergies  Allergen Reactions   Clindamycin/Lincomycin Anaphylaxis   Hydrochlorothiazide Other (See Comments)    "Made me feel like cement"- patient does not wish to take this again   Penicillins Hives, Itching and Other (See Comments)    Patient said she can tolerate Amoxicillin   Tape Rash    Patient Measurements: Height: 5\' 4"  (162.6 cm) Weight: 91 kg (200 lb 9.9 oz) IBW/kg (Calculated) : 54.7 Heparin dosing weight: 76 kg  Vital Signs: Temp: 97.7 F (36.5 C) (03/01 0530) Temp Source: Oral (03/01 0530) BP: 137/87 (03/01 0530) Pulse Rate: 91 (03/01 0530)  Labs: Recent Labs    05/28/23 0521 05/28/23 1238 05/29/23 0438 05/30/23 0629  HGB 15.9*  --  14.9  --   HCT 50.9*  --  46.9*  --   PLT 339  --  293  --   HEPARINUNFRC 0.50 0.45 0.50 0.39  CREATININE  --   --  0.71 0.69    Estimated Creatinine Clearance: 84.8 mL/min (by C-G formula based on SCr of 0.69 mg/dL).  Assessment: 1 yoF presents with worsening SOB/chest pain; found to have bilateral segmental PEs (no RH strain noted). Pharmacy consulted to dose IV heparin  Today, 05/30/2023: Daily heparin level continues to be therapeutic with 1900 units/hr  CBC stable SCr stable < 1.0 (baseline) No s/sx of bleeding or infusion issues noted  Goal of Therapy:  Heparin level 0.3-0.7 Monitor platelets by anticoagulation protocol: Yes   Plan:  Continue heparin infusion at 1900 units/hr  Daily heparin level and CBC Monitor for s/sx of bleeding daily    Hessie Knows, PharmD, BCPS Secure Chat if ?s 05/30/2023 9:48 AM

## 2023-05-30 NOTE — Progress Notes (Signed)
 Mobility Specialist - Progress Note  (RA) Pre-mobility: 111 bpm HR, 94% SpO2 During mobility: 115 bpm HR, 91% SpO2 Post-mobility: 118 bpm HR, 95% SPO2   05/30/23 0906  Mobility  Activity Ambulated independently in hallway  Level of Assistance Independent  Assistive Device None  Distance Ambulated (ft) 500 ft  Range of Motion/Exercises Active  Activity Response Tolerated well  Mobility Referral Yes  Mobility visit 1 Mobility  Mobility Specialist Start Time (ACUTE ONLY) [id number]  Mobility Specialist Stop Time (ACUTE ONLY) 0906  Mobility Specialist Time Calculation (min) (ACUTE ONLY) 10 min   Pt was found in bed and agreeable to ambulate. No complaints with session. At EOS returned to sit EOB with all needs met. Call bell in reach.  Billey Chang Mobility Specialist

## 2023-05-31 ENCOUNTER — Inpatient Hospital Stay (HOSPITAL_COMMUNITY)

## 2023-05-31 DIAGNOSIS — I2699 Other pulmonary embolism without acute cor pulmonale: Secondary | ICD-10-CM | POA: Diagnosis not present

## 2023-05-31 LAB — BODY FLUID CULTURE W GRAM STAIN: Culture: NO GROWTH

## 2023-05-31 LAB — BASIC METABOLIC PANEL
Anion gap: 12 (ref 5–15)
BUN: 14 mg/dL (ref 6–20)
CO2: 32 mmol/L (ref 22–32)
Calcium: 11.5 mg/dL — ABNORMAL HIGH (ref 8.9–10.3)
Chloride: 89 mmol/L — ABNORMAL LOW (ref 98–111)
Creatinine, Ser: 0.76 mg/dL (ref 0.44–1.00)
GFR, Estimated: >60 mL/min (ref >60)
Glucose, Bld: 204 mg/dL — ABNORMAL HIGH (ref 70–99)
Potassium: 4.2 mmol/L (ref 3.5–5.1)
Sodium: 133 mmol/L — ABNORMAL LOW (ref 135–145)

## 2023-05-31 LAB — HEPARIN LEVEL (UNFRACTIONATED): Heparin Unfractionated: 0.46 [IU]/mL (ref 0.30–0.70)

## 2023-05-31 MED ORDER — APIXABAN 5 MG PO TABS: 5.0000 mg | ORAL_TABLET | Freq: Two times a day (BID) | ORAL | Status: DC

## 2023-05-31 MED ORDER — APIXABAN 5 MG PO TABS
10.0000 mg | ORAL_TABLET | Freq: Two times a day (BID) | ORAL | Status: DC
  Administered 2023-05-31: 10 mg via ORAL
  Filled 2023-05-31: qty 2

## 2023-05-31 MED ORDER — APIXABAN (ELIQUIS) VTE STARTER PACK (10MG AND 5MG): ORAL_TABLET | ORAL | 0 refills | Status: DC

## 2023-05-31 MED ORDER — FUROSEMIDE 20 MG PO TABS: 60.0000 mg | ORAL_TABLET | Freq: Two times a day (BID) | ORAL | 3 refills | Status: DC

## 2023-05-31 NOTE — TOC Transition Note (Signed)
 Transition of Care [facility]) - Discharge Note   Patient Details  Name: Sharon Hensley MRN: [id number] Date of Birth: 24-Nov-1965  [facility] CM/SW Contact:  Howell Rucks, RN Phone Number: 05/31/2023, 2:45 PM   Clinical Narrative:   DC to home order. TOC consult for Medication Assistance for new rx for Eliquis. NCM provided pt 30 day free trial card for Eliquis. No further TOC needs identified.     Final next level of care: Home/Self Care Barriers to Discharge: Barriers Resolved   Patient Goals and CMS Choice Patient states their goals for this hospitalization and ongoing recovery are:: return home CMS Medicare.gov Compare Post Acute Care list provided to:: Patient Choice offered to / list presented to : Patient Chancellor ownership interest in [facility].provided to:: Patient    Discharge Placement                       Discharge Plan and Services Additional resources added to the After Visit Summary for                                       Social Drivers of Health (SDOH) Interventions SDOH Screenings   Food Insecurity: No Food Insecurity (05/26/2023)  Housing: Low Risk  (05/26/2023)  Transportation Needs: No Transportation Needs (05/26/2023)  Utilities: Not At Risk (05/26/2023)  Tobacco Use: Medium Risk (05/25/2023)     Readmission Risk Interventions    05/31/2023    2:44 PM  Readmission Risk Prevention Plan  Medication Screening Complete  Transportation Screening Complete

## 2023-05-31 NOTE — Discharge Summary (Signed)
 Physician Discharge Summary  Sharon Hensley ZOX:[id number] DOB: 1966/02/27 DOA: 05/25/2023  PCP: Gwenyth Bender, FNP  Admit date: 05/25/2023 Discharge date: 05/31/2023  Admitted From: Home Disposition:  Home  Recommendations for Outpatient Follow-up:  Follow up with PCP in 1-2 weeks Follow-up with GYN/oncology Dr. Alvester Morin in 1 week Amatory referral placed to cancer center to establish care for further workup of malignancy of unknown etiology Started on Eliquis for acute PE/DVT, will need medication refills Started on Lasix 60 mg PO BID to control recurrent pleural effusion Please obtain BMP in one week to assess renal function Recommend repeat TFTs 4-6 weeks Recommend repeat chest x-ray 2-3 weeks Please follow up on the following pending results: Cytology from thoracentesis [facility] plan for discharge  Home Health: No Equipment/Devices: None  Discharge Condition: Stable CODE STATUS: Full code Diet recommendation: Heart healthy diet  History of present illness:  Sharon Hensley is a 58 y.o. female with past medical history significant for hypothyroidism who presented to [facility] ED on 05/25/2023 with complaints of progressive shortness of breath, left-sided chest pain over the last 2 weeks.  Endorses shortness of breath with both rest and exercise.  She has chronic orthopnea and has been sleeping in the chair for years.  Recently started on hydrochlorothiazide due to lower extremity swelling over the last few weeks, but this was discontinued because she did not like how it made her feel.  Denies fever/chills, no abdominal pain, no nausea/vomiting.   In the ED, temperature 98.1 F, HR 109, RR 24, BP 133/93, SpO2 91% on room air.  WBC 8.7, hemoglobin 15.5, platelet count 270.  Sodium 140, potassium 3.7, chloride 104, CO2 25, glucose 175, BUN 8, creatinine 0.63.  High sensitive troponin 2> 2.  BNP 53.2.  D-dimer 11.97.  Hemoglobin A1c 6.8.  hCG negative.  TSH 6.108, free T41.06.  Influenza  A/B, RSV, COVID PCR negative.  Urinalysis unrevealing.  Chest x-ray with bilateral small/moderate pleural effusions with probable underlying compressive atelectatic changes, no pneumothorax.  CT angiogram chest with bilateral segmental pulm emboli, mild/moderate clot burden with no evidence of right heart strain, moderate bilateral pleural effusions with extensive compressive atelectasis throughout the lower lobes, complete atelectasis of the right middle lobe, no central obstructing lesion identified, multiple subcentimeter mediastinal and hilar lymph nodes nonspecific, upper abdominal ascites, aortic atherosclerosis.  CT abdomen/pelvis with contrast enlarged uterus secondary to a large heterogeneous hypoattenuating area in the fundus/uterine body concern for benign versus malignant uterine mass, small/moderate ascites, soft tissue nodularity/stranding in the upper abdomen, bilateral moderate to large pleural effusions with associated compressive atelectatic changes.  Patient was started on heparin drip. Gyn/onc was consulted given PE, ascites and uterine mass, previously thought to be benign concerning for malignancy.  TRH consulted for admission.  Hospital course:  Acute hypoxic respiratory failure Patient with worsening hypoxia during hospitalization with etiology likely secondary to bilateral segmental pulmonary emboli, and pleural effusions.  Patient was started on anticoagulation with heparin followed by Eliquis.  Also started on furosemide for management of her pleural effusions.  Oxygen was titrated off but no desaturation with exertion at time of discharge.   Pulmonary embolism, bilaterally Patient presenting with progressive shortness of breath, left-sided chest pain.  D-dimer elevated 11.97.  CT angiogram chest with bilateral segmental pulmonary emboli with mild/moderate clot burden with no evidence of right heart strain.  TTE with LVEF 60-65%, LV with no regional wall motion abnormalities, mild  concentric LVH, grade 1 diastolic dysfunction, no AR stenosis, IVC normal  in size, RV systolic function normal.  Initially started on heparin drip and transition to Eliquis on discharge.  Will need follow-up with PCP/oncology for medication refills.   Acute DVT Vascular duplex ultrasound bilateral lower extremities with findings consistent with acute DVT right femoral vein, right popliteal vein, right posterior tibial veins, right peroneal veins, left popliteal vein, left posterior tibial veins, left peroneal veins.  Initially started on heparin drip as above and transition to Eliquis at time of discharge.   Bilateral pleural effusions Imaging notable for bilateral pleural effusions with compressive atelectasis.  Underwent right thoracentesis by IR 2/27 with 920 mL amber-colored fluid removed and left thoracentesis by IR 2/28 with 750 mL fluid removed.  Continue furosemide 60 mg p.o. twice daily at time of discharge.  Cytology pending at time of discharge.   Uterine mass with ascites, pleural effusion, peritoneal disease/omental caking concerning for malignancy Patient presenting with progressive shortness of breath found to have an acute pulmonary embolism also with associated ascites, pleural effusion on CT imaging.  Pancreas noted to be unremarkable with no pancreatic ductal dilation or surrounding inflammatory changes on CT abdomen with contrast.  Uterine mass was previously evaluated in 2022 and thought was benign.  MR pelvis without contrast with 13.3 cm intramural fibroid left anterior uterine body, unable to exclude leiomyosarcoma, moderate pelvic ascites with suspected peritoneal disease/omental caking beneath anterior abdominal wall, mild pelvic lymphadenopathy suspicious for nodal metastasis.  Patient underwent endometrial biopsy by GYN/oncology 05/26/2023.  Patient reports family history of colon cancer and renal cell carcinoma, denies any previous colonoscopies but has undergone Cologuard  testing that has been reported negative. GYN/Onc was consulted and followed during hospital course.  CEA 0.9, wNL; CA 19-9 elevated 73; CA 125 elevated 1092.0. Pathology from EMB: Highly atypical glandular cells.  Cytology from thoracentesis pending at time of discharge.  May end up needing diagnostic laparoscopy for definitive diagnosis but per GYN/ONC too high risk given PE; deferring further management to outpatient setting.   Hypothyroidism TSH 6.108, free T41.06. Continue home levothyroxine 150 mcg p.o. daily. Repeat TFTs in 4-6 weeks  Discharge Diagnoses:  Principal Problem:   Bilateral pulmonary embolism (HCC) Active Problems:   Shortness of breath    Discharge Instructions  Discharge Instructions     Ambulatory referral to Hematology / Oncology   Complete by: As directed    Call MD for:  difficulty breathing, headache or visual disturbances   Complete by: As directed    Call MD for:  extreme fatigue   Complete by: As directed    Call MD for:  persistant dizziness or light-headedness   Complete by: As directed    Call MD for:  persistant nausea and vomiting   Complete by: As directed    Call MD for:  severe uncontrolled pain   Complete by: As directed    Call MD for:  temperature >100.4   Complete by: As directed    Diet - low sodium heart healthy   Complete by: As directed    Increase activity slowly   Complete by: As directed       Allergies as of 05/31/2023       Reactions   Clindamycin/lincomycin Anaphylaxis   Hydrochlorothiazide Other (See Comments)   "Made me feel like cement"- patient does not wish to take this again   Penicillins Hives, Itching, Other (See Comments)   Patient said she can tolerate Amoxicillin   Tape Rash        Medication List  STOP taking these medications    fluticasone 50 MCG/ACT nasal spray Commonly known as: FLONASE   ondansetron 4 MG tablet Commonly known as: ZOFRAN   predniSONE 5 MG (21) Tbpk tablet Commonly known  as: STERAPRED UNI-PAK 21 TAB       TAKE these medications    Acetaminophen Extra Strength 500 MG Caps Take 500-1,000 mg by mouth See admin instructions. Tylenol Rapid Release 500 mg capsules- Take 500-1,000 mg by mouth every 8 hours as needed for headaches or pain   Advil 200 MG Caps Generic drug: Ibuprofen Take 200-400 mg by mouth every 8 (eight) hours as needed (for pain or headaches).   Apixaban Starter Pack (10mg  and 5mg ) Commonly known as: ELIQUIS STARTER PACK Take as directed on package: start with two-5mg  tablets twice daily for 7 days. On day 8, switch to one-5mg  tablet twice daily.   famotidine-calcium carbonate-magnesium hydroxide 10-800-165 MG chewable tablet Commonly known as: PEPCID COMPLETE Chew 1 tablet by mouth at bedtime as needed (heartburn).   furosemide 20 MG tablet Commonly known as: Lasix Take 3 tablets (60 mg total) by mouth 2 (two) times daily.   levothyroxine 150 MCG tablet Commonly known as: SYNTHROID Take 150 mcg by mouth See admin instructions. Take 150 mcg by mouth in the morning 30 minutes before breakfast on Mon/Tues/Wed/Thurs/Fri/Sat- skip Sundays   oxymetazoline 0.05 % nasal spray Commonly known as: AFRIN Place 1 spray into both nostrils 2 (two) times daily as needed for congestion.   YumVs Beet Root-Tart Cherry 250-0.5 MG Chew Chew 2 tablets by mouth daily.        Follow-up Information     Gwenyth Bender, FNP. Schedule an appointment as soon as possible for a visit in 1 week(s).   Specialty: Family Medicine        Clide Cliff, MD. Schedule an appointment as soon as possible for a visit.   Specialty: Gynecologic Oncology Contact information: [address] [place] [address] [phone]         [facility] - A Dept Of [place]. [facility]. Schedule an appointment as soon as possible for a visit.   Specialty: Oncology Contact information: [address] [place]  [address] [phone]               Allergies  Allergen Reactions   Clindamycin/Lincomycin Anaphylaxis   Hydrochlorothiazide Other (See Comments)    "Made me feel like cement"- patient does not wish to take this again   Penicillins Hives, Itching and Other (See Comments)    Patient said she can tolerate Amoxicillin   Tape Rash    Consultations: GYN/oncology - Dr Alvester Morin   Procedures/Studies: US THORACENTESIS ASP PLEURAL SPACE W/IMG GUIDE Result Date: 05/29/2023 INDICATION: 58 year old female with uterine mass concerning for malignancy, pulmonary embolus, bilateral pleural effusion for left therapeutic and diagnostic thoracentesis. EXAM: ULTRASOUND GUIDED LEFT THORACENTESIS MEDICATIONS: 10 mL 1% lidocaine COMPLICATIONS: None immediate. PROCEDURE: An ultrasound guided thoracentesis was thoroughly discussed with the patient and questions answered. The benefits, risks, alternatives and complications were also discussed. The patient understands and wishes to proceed with the procedure. Written consent was obtained. Ultrasound was performed to localize and mark an adequate pocket of fluid in the left chest. The area was then prepped and draped in the normal sterile fashion. 1% Lidocaine was used for local anesthesia. Under ultrasound guidance a 8 Fr Safe-T-Centesis catheter was introduced. Thoracentesis was performed. The catheter was removed and  a dressing applied. FINDINGS: A total of approximately 750 mL of clear yellow fluid was removed. Samples were sent to the laboratory as requested by the clinical team. IMPRESSION: Successful ultrasound guided left thoracentesis yielding 750 mL of pleural fluid. Performed By Theresa Mulligan, PA-C Electronically Signed   By: Gilmer Mor D.O.   On: 05/29/2023 15:18   DG Chest 1 View Result Date: 05/29/2023 CLINICAL DATA:  Left thoracentesis EXAM: CHEST  1 VIEW COMPARISON:  05/28/2023 FINDINGS: Small bilateral pleural effusions with associated bibasilar  opacities. Left-sided effusion has decreased following thoracentesis. No pneumothorax. Stable heart size. IMPRESSION: Small bilateral pleural effusions with associated bibasilar opacities. Left-sided effusion has decreased following thoracentesis. No pneumothorax. Electronically Signed   By: Duanne Guess D.O.   On: 05/29/2023 15:00   DG CHEST PORT 1 VIEW Result Date: 05/28/2023 CLINICAL DATA:  Dyspnea, bilateral pulmonary emboli EXAM: PORTABLE CHEST 1 VIEW COMPARISON:  05/28/2023 FINDINGS: Single frontal view of the chest demonstrates a stable cardiac silhouette. There are progressive bilateral veiling opacities, left greater than right. No pneumothorax. No acute bony abnormalities. IMPRESSION: 1. Progressive bibasilar veiling opacities consistent with worsening bibasilar consolidation and enlarging effusions. Electronically Signed   By: Sharlet Salina M.D.   On: 05/28/2023 20:41   DG Chest 1 View Result Date: 05/28/2023 CLINICAL DATA:  Status post right thoracentesis. EXAM: CHEST  1 VIEW COMPARISON:  Chest radiographs 05/28/2023, 224 2025 FINDINGS: There is improvement in the prior layering right pleural effusion. Unchanged mild-to-moderate left pleural effusion. Thickening of right minor fissure from tracking fluid is similar to prior. No pneumothorax is seen. The visualized cardiac silhouette and mediastinal contours are within normal limits. Moderate multilevel degenerative disc changes of the thoracic spine. IMPRESSION: 1. Improvement in the prior layering right pleural effusion following interval thoracentesis. No pneumothorax. 2. Unchanged mild-to-moderate left pleural effusion. Electronically Signed   By: Neita Garnet M.D.   On: 05/28/2023 14:50   [place] ASCITES (ABDOMEN LIMITED) Result Date: 05/28/2023 INDICATION: 58 year old female with uterine mass concerning for malignancy with ascites for diagnostic paracentesis. EXAM: ULTRASOUND ABDOMEN LIMITED FINDINGS: Imaging of all 4 quadrants of the  abdomen reveal trace ascites IMPRESSION: Small amount of ascites seen on ultrasound. After discussion of the risks versus benefits of the procedure the patient decided to defer paracentesis at this time. Performed By Theresa Mulligan, PA-C Electronically Signed   By: Gilmer Mor D.O.   On: 05/28/2023 14:31   US THORACENTESIS ASP PLEURAL SPACE W/IMG GUIDE Result Date: 05/28/2023 INDICATION: 58 year old female with uterine mass concerning for maliganancy, pulmonary embolus, bilateral pleural effusion for right therapeautic and diagnostic thoracentesis. EXAM: ULTRASOUND GUIDED RIGHT THORACENTESIS MEDICATIONS: 12 mL 1% lidocaine COMPLICATIONS: None immediate. PROCEDURE: An ultrasound guided thoracentesis was thoroughly discussed with the patient and questions answered. The benefits, risks, alternatives and complications were also discussed. The patient understands and wishes to proceed with the procedure. Written consent was obtained. Ultrasound was performed to localize and mark an adequate pocket of fluid in the right chest. The area was then prepped and draped in the normal sterile fashion. 1% Lidocaine was used for local anesthesia. Under ultrasound guidance a 8 Fr Safe-T-Centesis catheter was introduced. Thoracentesis was performed. The catheter was removed and a dressing applied. FINDINGS: A total of approximately 920 mL of clear red fluid was removed. Samples were sent to the laboratory as requested by the clinical team. IMPRESSION: Successful ultrasound guided right thoracentesis yielding 920 mL of pleural fluid. Performed By Theresa Mulligan, PA-C Electronically  Signed   By: Gilmer Mor D.O.   On: 05/28/2023 14:31   DG Chest 2 View Result Date: 05/28/2023 CLINICAL DATA:  Pleural effusion. EXAM: CHEST - 2 VIEW COMPARISON:  May 25, 2023. FINDINGS: Stable cardiomediastinal silhouette. Stable bilateral pleural effusions with associated bibasilar atelectasis. Bony thorax is unremarkable. IMPRESSION:  Stable bilateral pleural effusions with associated bibasilar atelectasis. Electronically Signed   By: Lupita Raider M.D.   On: 05/28/2023 09:50   MR PELVIS WO CONTRAST Result Date: 05/26/2023 CLINICAL DATA:  Uterine mass (fibroid versus sarcoma), concern for metastatic disease EXAM: MRI PELVIS WITHOUT CONTRAST TECHNIQUE: Multiplanar multisequence MR imaging of the pelvis was performed. No intravenous contrast was administered. COMPARISON:  CT abdomen/pelvis dated 05/25/2023. FINDINGS: Urinary Tract:  Bladder is underdistended but unremarkable. Bowel:  Visualized bowel is grossly unremarkable. Vascular/Lymphatic: No evidence of aneurysm. Small bilateral pelvic sidewall nodes measuring up to 10-11 mm short axis (series 4/image 25), suspicious. Reproductive: Uterus is notable for multiple uterine fibroids (series 4/images 21, 24, and 26), including a dominant 13.3 x 11.4 x 11.8 cm intramural fibroid in the left anterior uterine body. Suspected right ovary is within normal limits (series 4/image 19). Left ovary is not discretely visualized. Other:  Moderate pelvic ascites. Suspected abnormal peritoneal soft tissue/omental caking beneath the anterior abdominal wall (series 7/images 3 and 5), mostly excluded on this pelvic MR. Musculoskeletal: No focal osseous lesions. IMPRESSION: Dominant 13.3 cm intramural fibroid in the left anterior uterine body. While uterine leiomyosarcoma cannot be excluded by imaging, the lesion does not demonstrate suspicious imaging findings by MR. Moderate pelvic ascites with suspected peritoneal disease/omental caking beneath the anterior abdominal wall, incompletely visualized. Mild pelvic lymphadenopathy, suspicious for nodal metastases. Assuming this does in fact reflect a benign fibroid, the underlying site of primary malignancy is not evident on this study. Electronically Signed   By: Charline Bills M.D.   On: 05/26/2023 22:08   VAS [place] LOWER EXTREMITY VENOUS (DVT) Result Date:  05/26/2023  Lower Venous DVT Study Patient Name:  KRYSTIANA FORNES  Date of Exam:   05/26/2023 Medical Rec #: [id number]   Accession #:    [id number] Date of Birth: 05/05/1965   Patient Gender: F Patient Age:   33 years Exam Location[facility]  [facility] Procedure:      VAS [place] LOWER EXTREMITY VENOUS (DVT) Referring Phys: A POWELL JR --------------------------------------------------------------------------------  Indications: Edema, and pulmonary embolism.  Risk Factors: Confirmed PE. Anticoagulation: Heparin. Comparison Study: No prior studies. Performing Technologist: Chanda Busing RVT  Examination Guidelines: A complete evaluation includes B-mode imaging, spectral Doppler, color Doppler, and power Doppler as needed of all accessible portions of each vessel. Bilateral testing is considered an integral part of a complete examination. Limited examinations for reoccurring indications may be performed as noted. The reflux portion of the exam is performed with the patient in reverse Trendelenburg.  +---------+---------------+---------+-----------+----------+--------------+ RIGHT    CompressibilityPhasicitySpontaneityPropertiesThrombus Aging +---------+---------------+---------+-----------+----------+--------------+ CFV      Full           Yes      Yes                                 +---------+---------------+---------+-----------+----------+--------------+ SFJ      Full                                                        +---------+---------------+---------+-----------+----------+--------------+  FV Prox  Full                                                        +---------+---------------+---------+-----------+----------+--------------+ FV Mid   Full                                                        +---------+---------------+---------+-----------+----------+--------------+ FV DistalPartial        Yes      Yes                  Acute           +---------+---------------+---------+-----------+----------+--------------+ PFV      Full                                                        +---------+---------------+---------+-----------+----------+--------------+ POP      Partial        Yes      Yes                  Acute          +---------+---------------+---------+-----------+----------+--------------+ PTV      None                                         Acute          +---------+---------------+---------+-----------+----------+--------------+ PERO     Partial                                      Acute          +---------+---------------+---------+-----------+----------+--------------+ Gastroc  None                                         Acute          +---------+---------------+---------+-----------+----------+--------------+ SSV      None                                         Acute          +---------+---------------+---------+-----------+----------+--------------+   +---------+---------------+---------+-----------+----------+--------------+ LEFT     CompressibilityPhasicitySpontaneityPropertiesThrombus Aging +---------+---------------+---------+-----------+----------+--------------+ CFV      Full           Yes      Yes                                 +---------+---------------+---------+-----------+----------+--------------+ SFJ      Full                                                        +---------+---------------+---------+-----------+----------+--------------+  FV Prox  Full                                                        +---------+---------------+---------+-----------+----------+--------------+ FV Mid   Full                                                        +---------+---------------+---------+-----------+----------+--------------+ FV DistalFull                                                         +---------+---------------+---------+-----------+----------+--------------+ PFV      Full                                                        +---------+---------------+---------+-----------+----------+--------------+ POP      None           No       No                   Acute          +---------+---------------+---------+-----------+----------+--------------+ PTV      None                                         Acute          +---------+---------------+---------+-----------+----------+--------------+ PERO     None                                         Acute          +---------+---------------+---------+-----------+----------+--------------+ Gastroc  None                                         Acute          +---------+---------------+---------+-----------+----------+--------------+ SSV      None                                         Acute          +---------+---------------+---------+-----------+----------+--------------+     Summary: RIGHT: - Findings consistent with acute deep vein thrombosis involving the right femoral vein, right popliteal vein, right posterior tibial veins, and right peroneal veins. - Findings consistent with acute superficial vein thrombosis involving the right small saphenous vein. Findings consistent with acute intramuscular thrombosis involving the right gastrocnemius veins. - No cystic structure found in the popliteal fossa.  LEFT: - Findings consistent with acute deep vein thrombosis involving the  left popliteal vein, left posterior tibial veins, and left peroneal veins. - Findings consistent with acute superficial vein thrombosis involving the left small saphenous vein. Findings consistent with acute intramuscular thrombosis involving the left gastrocnemius veins. - No cystic structure found in the popliteal fossa.  *See table(s) above for measurements and observations. Electronically signed by Lemar Livings MD on 05/26/2023 at 3:29:25  PM.    Final    ECHOCARDIOGRAM COMPLETE Result Date: 05/26/2023    ECHOCARDIOGRAM REPORT   Patient Name:   Lundynn Kallenberger Date of Exam: 05/26/2023 Medical Rec #:  [id number]  Height:       64.0 in Accession #:    [id number] Weight:       209.0 lb Date of Birth:  1965-09-20  BSA:          1.993 m Patient Age:    57 years   BP:           122/70 mmHg Patient Gender: F          HR:           94 bpm. Exam Location:  Inpatient Procedure: 2D Echo, Color Doppler and Cardiac Doppler (Both Spectral and Color            Flow Doppler were utilized during procedure). Indications:    Pulmonary Embolus I26.09  History:        Patient has no prior history of Echocardiogram examinations.  Sonographer:    Harriette Bouillon RDCS Referring Phys: [phone] A CALDWELL POWELL JR IMPRESSIONS  1. Left ventricular ejection fraction, by estimation, is 60 to 65%. The left ventricle has normal function. The left ventricle has no regional wall motion abnormalities. There is mild concentric left ventricular hypertrophy. Left ventricular diastolic parameters are consistent with Grade I diastolic dysfunction (impaired relaxation).  2. Right ventricular systolic function is normal. The right ventricular size is normal. Tricuspid regurgitation signal is inadequate for assessing PA pressure.  3. The mitral valve is normal in structure. No evidence of mitral valve regurgitation. No evidence of mitral stenosis.  4. The aortic valve is tricuspid. Aortic valve regurgitation is not visualized. No aortic stenosis is present.  5. The inferior vena cava is normal in size with greater than 50% respiratory variability, suggesting right atrial pressure of 3 mmHg.  6. Left pleural effusion present. FINDINGS  Left Ventricle: Left ventricular ejection fraction, by estimation, is 60 to 65%. The left ventricle has normal function. The left ventricle has no regional wall motion abnormalities. Strain imaging was not performed. The left ventricular internal cavity  size was  normal in size. There is mild concentric left ventricular hypertrophy. Left ventricular diastolic parameters are consistent with Grade I diastolic dysfunction (impaired relaxation). Right Ventricle: The right ventricular size is normal. No increase in right ventricular wall thickness. Right ventricular systolic function is normal. Tricuspid regurgitation signal is inadequate for assessing PA pressure. Left Atrium: Left atrial size was normal in size. Right Atrium: Right atrial size was normal in size. Pericardium: Left pleural effusion present. There is no evidence of pericardial effusion. Mitral Valve: The mitral valve is normal in structure. No evidence of mitral valve regurgitation. No evidence of mitral valve stenosis. Tricuspid Valve: The tricuspid valve is normal in structure. Tricuspid valve regurgitation is not demonstrated. Aortic Valve: The aortic valve is tricuspid. Aortic valve regurgitation is not visualized. No aortic stenosis is present. Pulmonic Valve: The pulmonic valve was normal in structure. Pulmonic valve regurgitation is not visualized. Aorta: The aortic root is normal  in size and structure. Venous: The inferior vena cava is normal in size with greater than 50% respiratory variability, suggesting right atrial pressure of 3 mmHg. IAS/Shunts: No atrial level shunt detected by color flow Doppler. Additional Comments: 3D imaging was not performed.  LEFT VENTRICLE PLAX 2D LVIDd:         3.60 cm   Diastology LVIDs:         2.30 cm   LV e' medial:    4.68 cm/s LV PW:         1.20 cm   LV E/e' medial:  18.5 LV IVS:        1.20 cm   LV e' lateral:   9.25 cm/s LVOT diam:     2.00 cm   LV E/e' lateral: 9.3 LV SV:         67 LV SV Index:   34 LVOT Area:     3.14 cm  RIGHT VENTRICLE             IVC RV S prime:     10.80 cm/s  IVC diam: 1.60 cm TAPSE (M-mode): 1.7 cm LEFT ATRIUM             Index        RIGHT ATRIUM           Index LA diam:        2.60 cm 1.30 cm/m   RA Area:     13.40 cm LA Vol (A2C):    45.9 ml 23.03 ml/m  RA Volume:   32.00 ml  16.05 ml/m LA Vol (A4C):   32.3 ml 16.20 ml/m LA Biplane Vol: 41.5 ml 20.82 ml/m  AORTIC VALVE LVOT Vmax:   129.00 cm/s LVOT Vmean:  83.900 cm/s LVOT VTI:    0.213 m  AORTA Ao Root diam: 2.70 cm Ao Asc diam:  3.60 cm MITRAL VALVE MV Area (PHT): 6.54 cm     SHUNTS MV Decel Time: 116 msec     Systemic VTI:  0.21 m MV E velocity: 86.40 cm/s   Systemic Diam: 2.00 cm MV A velocity: 114.00 cm/s MV E/A ratio:  0.76 Dalton McleanMD Electronically signed by Wilfred Lacy Signature Date/Time: 05/26/2023/12:50:45 PM    Final    CT Angio Chest PE W/Cm &/Or Wo Cm Result Date: 05/25/2023 CLINICAL DATA:  Intermittent shortness of breath for 2 weeks, left-sided chest pain EXAM: CT ANGIOGRAPHY CHEST WITH CONTRAST TECHNIQUE: Multidetector CT imaging of the chest was performed using the standard protocol during bolus administration of intravenous contrast. Multiplanar CT image reconstructions and MIPs were obtained to evaluate the vascular anatomy. RADIATION DOSE REDUCTION: This exam was performed according to the departmental dose-optimization program which includes automated exposure control, adjustment of the mA and/or kV according to patient size and/or use of iterative reconstruction technique. CONTRAST:  OMNIPAQUE IOHEXOL 350 MG/ML SOLN COMPARISON:  05/25/2023 FINDINGS: Cardiovascular: This is a technically adequate evaluation of the pulmonary vasculature. There are bilateral pulmonary emboli, primarily within the segmental vessels of the left upper, right upper, and right lower lobes. Mild to moderate clot burden. No evidence of right heart strain. The heart is unremarkable without pericardial effusion. No evidence of thoracic aortic aneurysm or dissection. Atherosclerosis of the aorta and coronary vasculature. Mediastinum/Nodes: Nonspecific subcentimeter lymph nodes throughout the mediastinum and hilar regions. Thyroid, trachea, and esophagus are grossly unremarkable.  Lungs/Pleura: Moderate bilateral pleural effusions, right greater than left, with significant compressive atelectasis within the bilateral lower lobes. There is also  complete consolidation with volume loss in the right middle lobe compatible with atelectasis. This is of uncertain chronicity. No evidence of central obstructing mass. No airspace disease or pneumothorax.  Central airways are patent. Upper Abdomen: Small volume ascites.  Multiple hepatic cysts. Musculoskeletal: No acute or destructive bony abnormalities. Reconstructed images demonstrate no additional findings. Review of the MIP images confirms the above findings. IMPRESSION: 1. Bilateral segmental pulmonary emboli. Mild moderate clot burden with no evidence of right heart strain. 2. Moderate bilateral pleural effusions, with extensive compressive atelectasis throughout the lower lobes. 3. Complete atelectasis of the right middle lobe, of uncertain chronicity. No central obstructing lesion identified. 4. Numerous subcentimeter mediastinal and hilar lymph nodes, nonspecific. No pathologic adenopathy. 5. Upper abdominal ascites. 6. Aortic Atherosclerosis (ICD10-I70.0). Coronary artery atherosclerosis. Critical Value/emergent results were called by telephone at the time of interpretation on 05/25/2023 at 3:46 pm to provider [facility] , who verbally acknowledged these results. Electronically Signed   By: Sharlet Salina M.D.   On: 05/25/2023 15:54   CT ABDOMEN PELVIS W CONTRAST Result Date: 05/25/2023 CLINICAL DATA:  Abdominal pain, acute, nonlocalized. EXAM: CT ABDOMEN AND PELVIS WITH CONTRAST TECHNIQUE: Multidetector CT imaging of the abdomen and pelvis was performed using the standard protocol following bolus administration of intravenous contrast. RADIATION DOSE REDUCTION: This exam was performed according to the departmental dose-optimization program which includes automated exposure control, adjustment of the mA and/or kV according to patient size  and/or use of iterative reconstruction technique. CONTRAST:  OMNIPAQUE IOHEXOL 350 MG/ML SOLN COMPARISON:  CT scan abdomen and pelvis from 04/01/2003. FINDINGS: Lower chest: Bilateral moderate to large pleural effusions noted with associated compressive atelectatic changes. The heart is normal in size. No pericardial effusion. Hepatobiliary: The liver is normal in size. Non-cirrhotic configuration. There are at least 2 simple cysts in the liver with largest measuring up to 3.3 x 3.4 cm. The cysts are present since the prior study dating back to 2005 and exhibits significant interval growth. No intrahepatic or extrahepatic bile duct dilation. Gallbladder is surgically absent. Pancreas: Unremarkable. No pancreatic ductal dilatation or surrounding inflammatory changes. Spleen: Within normal limits. No focal lesion. Adrenals/Urinary Tract: Adrenal glands are unremarkable. No suspicious renal mass. No hydronephrosis. No renal or ureteric calculi. Urinary bladder is under distended, precluding optimal assessment. However, no large mass or stones identified. No perivesical fat stranding. Stomach/Bowel: No disproportionate dilation of the small or large bowel loops. No evidence of abnormal bowel wall thickening or inflammatory changes. The appendix is unremarkable. Vascular/Lymphatic: There is small-to-moderate ascites mainly in the perihepatic/perisplenic region, dependent pelvis and bilateral paracolic gutters. There are soft tissue nodularity/stranding in the upper abdomen, which is nonspecific but can be seen with ascites or due to peritoneal carcinomatosis. No pneumoperitoneum. There are mildly enlarged retroperitoneal lymph nodes, which are indeterminate in etiology. No aneurysmal dilation of the major abdominal arteries. There are mild peripheral atherosclerotic vascular calcifications of the aorta and its major branches. Reproductive: The uterus is enlarged secondary to a markedly enlarged 12.9 x 13.7 cm  heterogeneous hypoattenuating area in the fundus/uterine body region. The uterus is probably pushed to the right and inferiorly however, not well evaluated. Endometrium is not seen. Bilateral ovaries are not distinctly visualized on this exam however, no large adnexal mass seen. Other: There is a small fat containing umbilical hernia. The soft tissues and abdominal wall are otherwise unremarkable. Musculoskeletal: No suspicious osseous lesions. There are mild multilevel degenerative changes in the visualized spine. IMPRESSION: 1. Enlarged uterus secondary to  a large heterogeneous hypoattenuating area in the fundus/uterine body region. This is not well evaluated on the current exam. Differential diagnosis includes benign versus malignant uterine mass. Correlate clinically. Consider additional imaging as indicated. 2. There is small-to-moderate ascites. There is also soft tissue nodularity/stranding in the upper abdomen, which is nonspecific but can be seen with ascites or due to peritoneal carcinomatosis. 3. Bilateral moderate to large pleural effusions with associated compressive atelectatic changes. 4. Multiple other nonacute observations, as described above. Aortic Atherosclerosis (ICD10-I70.0). Electronically Signed   By: Jules Schick M.D.   On: 05/25/2023 15:44   DG Chest Port 1 View Result Date: 05/25/2023 CLINICAL DATA:  Shortness of breath. EXAM: PORTABLE CHEST 1 VIEW COMPARISON:  07/20/2013. FINDINGS: Low lung volume. Mild diffuse pulmonary vascular congestion, likely accentuated by low lung volume. No frank pulmonary edema. There are bilateral small-to-moderate pleural effusions with probable underlying compressive atelectatic changes. No pneumothorax. Normal cardio-mediastinal silhouette. No acute osseous abnormalities. The soft tissues are within normal limits. IMPRESSION: Findings concerning for congestive heart failure/pulmonary edema. Correlate clinically. Electronically Signed   By: Jules Schick M.D.   On: 05/25/2023 12:28     Subjective:   Discharge Exam: Vitals:   05/31/23 0547 05/31/23 1322  BP: 132/87 134/88  Pulse: 94 98  Resp: 18 16  Temp: 97.8 F (36.6 C) 98.1 F (36.7 C)  SpO2: 94% 96%   Vitals:   05/30/23 2128 05/31/23 0547 05/31/23 0650 05/31/23 1322  BP: 129/89 132/87  134/88  Pulse: 96 94  98  Resp: 16 18  16   Temp: 98.1 F (36.7 C) 97.8 F (36.6 C)  98.1 F (36.7 C)  TempSrc: Oral Oral  Oral  SpO2: 95% 94%  96%  Weight:   90.3 kg   Height:        Physical Exam: GEN: NAD, alert and oriented x 3, wd/wn HEENT: NCAT, PERRL, EOMI, sclera clear, MMM PULM: CTAB w/o wheezes/crackles, normal respiratory effort CV: RRR w/o M/G/R GI: abd soft, NTND, NABS, no R/G/M MSK: no peripheral edema, muscle strength globally intact 5/5 bilateral upper/lower extremities NEURO: CN II-XII intact, no focal deficits, sensation to light touch intact PSYCH: normal mood/affect Integumentary: dry/intact, no rashes or wounds    The results of significant diagnostics from this hospitalization (including imaging, microbiology, ancillary and laboratory) are listed below for reference.     Microbiology: Recent Results (from the past 240 hours)  Resp panel by RT-PCR (RSV, Flu A&B, Covid) Anterior Nasal Swab     Status: None   Collection Time: 05/25/23 12:12 PM   Specimen: Anterior Nasal Swab  Result Value Ref Range Status   SARS Coronavirus 2 by RT PCR NEGATIVE NEGATIVE Final    Comment: (NOTE) SARS-CoV-2 target nucleic acids are NOT DETECTED.  The SARS-CoV-2 RNA is generally detectable in upper respiratory specimens during the acute phase of infection. The lowest concentration of SARS-CoV-2 viral copies this assay can detect is 138 copies/mL. A negative result does not preclude SARS-Cov-2 infection and should not be used as the sole basis for treatment or other patient management decisions. A negative result may occur with  improper specimen  collection/handling, submission of specimen other than nasopharyngeal swab, presence of viral mutation(s) within the areas targeted by this assay, and inadequate number of viral copies(<138 copies/mL). A negative result must be combined with clinical observations, patient history, and epidemiological information. The expected result is Negative.  Fact Sheet for Patients:  BloggerCourse.com  Fact Sheet for Healthcare Providers:  SeriousBroker.it  This test is no t yet approved or cleared by the [place] and  has been authorized for detection and/or diagnosis of SARS-CoV-2 by FDA under an Emergency Use Authorization (EUA). This EUA will remain  in effect (meaning this test can be used) for the duration of the COVID-19 declaration under Section 564(b)(1) of the Act, 21 U.S.C.section 360bbb-3(b)(1), unless the authorization is terminated  or revoked sooner.       Influenza A by PCR NEGATIVE NEGATIVE Final   Influenza B by PCR NEGATIVE NEGATIVE Final    Comment: (NOTE) The Xpert Xpress SARS-CoV-2/FLU/RSV plus assay is intended as an aid in the diagnosis of influenza from Nasopharyngeal swab specimens and should not be used as a sole basis for treatment. Nasal washings and aspirates are unacceptable for Xpert Xpress SARS-CoV-2/FLU/RSV testing.  Fact Sheet for Patients: BloggerCourse.com  Fact Sheet for Healthcare Providers: SeriousBroker.it  This test is not yet approved or cleared by the [place] FDA and has been authorized for detection and/or diagnosis of SARS-CoV-2 by FDA under an Emergency Use Authorization (EUA). This EUA will remain in effect (meaning this test can be used) for the duration of the COVID-19 declaration under Section 564(b)(1) of the Act, 21 U.S.C. section 360bbb-3(b)(1), unless the authorization is terminated or revoked.     Resp Syncytial  Virus by PCR NEGATIVE NEGATIVE Final    Comment: (NOTE) Fact Sheet for Patients: BloggerCourse.com  Fact Sheet for Healthcare Providers: SeriousBroker.it  This test is not yet approved or cleared by the [place] FDA and has been authorized for detection and/or diagnosis of SARS-CoV-2 by FDA under an Emergency Use Authorization (EUA). This EUA will remain in effect (meaning this test can be used) for the duration of the COVID-19 declaration under Section 564(b)(1) of the Act, 21 U.S.C. section 360bbb-3(b)(1), unless the authorization is terminated or revoked.  Performed at [facility], [address] [place], [place] [address]   Body fluid culture w Gram Stain     Status: None (Preliminary result)   Collection Time: 05/28/23 11:12 AM   Specimen: PATH Cytology Pleural fluid  Result Value Ref Range Status   Specimen Description   Final    PLEURAL Performed at [facility], [address] [place], [place] [address]    Special Requests   Final    NONE Performed at [facility], [address], [place], [place] [address]    Gram Stain   Final    FEW WBC PRESENT, PREDOMINANTLY MONONUCLEAR NO ORGANISMS SEEN    Culture   Final    NO GROWTH 3 DAYS Performed at [facility], [address], [place], [place] [address]    Report Status PENDING  Incomplete     Labs: BNP (last 3 results) Recent Labs    05/25/23 1107 05/25/23 2239  BNP 53.2 17.1   Basic Metabolic Panel: Recent Labs  Lab 05/26/23 0525 05/27/23 0455 05/29/23 0438 05/30/23 0629 05/31/23 0822  NA 143 139 135 136 133*  K 3.7 3.7 3.6 4.0 4.2  CL 104 100 95* 95* 89*  CO2 28 27 32 33* 32  GLUCOSE 152* 135* 135* 132* 204*  BUN 9 11 13 13 14   CREATININE 0.68 0.65 0.71 0.69 0.76  CALCIUM 10.3 9.9 10.7* 10.7* 11.5*  MG  --  2.2 2.2 2.5*  --   PHOS  --  3.1  --   --   --    Liver Function  Tests: Recent Labs  Lab 05/25/23 1341 05/26/23 0525 05/29/23 0438  AST 27 19 19   ALT 29 27 35  ALKPHOS 105 102 100  BILITOT 0.6 0.5 0.5  PROT 7.3 6.8 6.7  ALBUMIN 3.9 3.6 3.4*   No results for input(s): "LIPASE", "AMYLASE" in the last 168 hours. No results for input(s): "AMMONIA" in the last 168 hours. CBC: Recent Labs  Lab 05/25/23 1107 05/26/23 0525 05/27/23 0455 05/28/23 0521 05/29/23 0438  WBC 8.7 8.3 8.8 8.2 8.3  HGB 15.5* 14.1 14.1 15.9* 14.9  HCT 49.1* 44.0 45.5 50.9* 46.9*  MCV 91.8 91.3 95.0 92.4 91.4  PLT 270 272 270 339 293   Cardiac Enzymes: No results for input(s): "CKTOTAL", "CKMB", "CKMBINDEX", "TROPONINI" in the last 168 hours. BNP: Invalid input(s): "POCBNP" CBG: Recent Labs  Lab 05/29/23 1101  GLUCAP 184*   D-Dimer No results for input(s): "DDIMER" in the last 72 hours. Hgb A1c No results for input(s): "HGBA1C" in the last 72 hours. Lipid Profile No results for input(s): "CHOL", "HDL", "LDLCALC", "TRIG", "CHOLHDL", "LDLDIRECT" in the last 72 hours. Thyroid function studies No results for input(s): "TSH", "T4TOTAL", "T3FREE", "THYROIDAB" in the last 72 hours.  Invalid input(s): "FREET3" Anemia work up No results for input(s): "VITAMINB12", "FOLATE", "FERRITIN", "TIBC", "IRON", "RETICCTPCT" in the last 72 hours. Urinalysis    Component Value Date/Time   COLORURINE YELLOW 05/26/2023 0214   APPEARANCEUR HAZY (A) 05/26/2023 0214   LABSPEC >1.046 (H) 05/26/2023 0214   PHURINE 5.0 05/26/2023 0214   GLUCOSEU NEGATIVE 05/26/2023 0214   HGBUR NEGATIVE 05/26/2023 0214   BILIRUBINUR NEGATIVE 05/26/2023 0214   KETONESUR NEGATIVE 05/26/2023 0214   PROTEINUR NEGATIVE 05/26/2023 0214   UROBILINOGEN 0.2 08/17/2013 0130   NITRITE NEGATIVE 05/26/2023 0214   LEUKOCYTESUR NEGATIVE 05/26/2023 0214   Sepsis Labs Recent Labs  Lab 05/26/23 0525 05/27/23 0455 05/28/23 0521 05/29/23 0438  WBC 8.3 8.8 8.2 8.3   Microbiology Recent Results (from the  past 240 hours)  Resp panel by RT-PCR (RSV, Flu A&B, Covid) Anterior Nasal Swab     Status: None   Collection Time: 05/25/23 12:12 PM   Specimen: Anterior Nasal Swab  Result Value Ref Range Status   SARS Coronavirus 2 by RT PCR NEGATIVE NEGATIVE Final    Comment: (NOTE) SARS-CoV-2 target nucleic acids are NOT DETECTED.  The SARS-CoV-2 RNA is generally detectable in upper respiratory specimens during the acute phase of infection. The lowest concentration of SARS-CoV-2 viral copies this assay can detect is 138 copies/mL. A negative result does not preclude SARS-Cov-2 infection and should not be used as the sole basis for treatment or other patient management decisions. A negative result may occur with  improper specimen collection/handling, submission of specimen other than nasopharyngeal swab, presence of viral mutation(s) within the areas targeted by this assay, and inadequate number of viral copies(<138 copies/mL). A negative result must be combined with clinical observations, patient history, and epidemiological information. The expected result is Negative.  Fact Sheet for Patients:  BloggerCourse.com  Fact Sheet for Healthcare Providers:  SeriousBroker.it  This test is no t yet approved or cleared by the [place] FDA and  has been authorized for detection and/or diagnosis of SARS-CoV-2 by FDA under an Emergency Use Authorization (EUA). This EUA will remain  in effect (meaning this test can be used) for the duration of the COVID-19 declaration under Section 564(b)(1) of the Act, 21 U.S.C.section 360bbb-3(b)(1), unless the authorization is terminated  or revoked sooner.  Influenza A by PCR NEGATIVE NEGATIVE Final   Influenza B by PCR NEGATIVE NEGATIVE Final    Comment: (NOTE) The Xpert Xpress SARS-CoV-2/FLU/RSV plus assay is intended as an aid in the diagnosis of influenza from Nasopharyngeal swab specimens  and should not be used as a sole basis for treatment. Nasal washings and aspirates are unacceptable for Xpert Xpress SARS-CoV-2/FLU/RSV testing.  Fact Sheet for Patients: BloggerCourse.com  Fact Sheet for Healthcare Providers: SeriousBroker.it  This test is not yet approved or cleared by the [place] FDA and has been authorized for detection and/or diagnosis of SARS-CoV-2 by FDA under an Emergency Use Authorization (EUA). This EUA will remain in effect (meaning this test can be used) for the duration of the COVID-19 declaration under Section 564(b)(1) of the Act, 21 U.S.C. section 360bbb-3(b)(1), unless the authorization is terminated or revoked.     Resp Syncytial Virus by PCR NEGATIVE NEGATIVE Final    Comment: (NOTE) Fact Sheet for Patients: BloggerCourse.com  Fact Sheet for Healthcare Providers: SeriousBroker.it  This test is not yet approved or cleared by the [place] FDA and has been authorized for detection and/or diagnosis of SARS-CoV-2 by FDA under an Emergency Use Authorization (EUA). This EUA will remain in effect (meaning this test can be used) for the duration of the COVID-19 declaration under Section 564(b)(1) of the Act, 21 U.S.C. section 360bbb-3(b)(1), unless the authorization is terminated or revoked.  Performed at [facility], [address], [place], [place] [address]   Body fluid culture w Gram Stain     Status: None (Preliminary result)   Collection Time: 05/28/23 11:12 AM   Specimen: PATH Cytology Pleural fluid  Result Value Ref Range Status   Specimen Description   Final    PLEURAL Performed at [facility], [address] [place], [place] [address]    Special Requests   Final    NONE Performed at [facility], [address], [place], [place] [address]    Gram Stain   Final     FEW WBC PRESENT, PREDOMINANTLY MONONUCLEAR NO ORGANISMS SEEN    Culture   Final    NO GROWTH 3 DAYS Performed at [facility], [address], [place], [place] [address]    Report Status PENDING  Incomplete     Time coordinating discharge: Over 30 minutes  SIGNED:   Alvira Philips [place], DO  [facility] 05/31/2023, 2:05 PM

## 2023-05-31 NOTE — Progress Notes (Signed)
 Mobility Specialist - Progress Note  (RA) Pre-mobility: 109 bpm HR, 94% SpO2 During mobility: 123 bpm HR, 90% SpO2 Post-mobility: 122 bpm HR, 92% SPO2   05/31/23 0903  Mobility  Activity Ambulated independently in hallway;Ambulated independently to bathroom  Level of Assistance Independent  Assistive Device None  Distance Ambulated (ft) 500 ft  Range of Motion/Exercises Active  Activity Response Tolerated well  Mobility Referral Yes  Mobility visit 1 Mobility  Mobility Specialist Start Time (ACUTE ONLY) [id number]  Mobility Specialist Stop Time (ACUTE ONLY) [id number]  Mobility Specialist Time Calculation (min) (ACUTE ONLY) 10 min   Pt was found in bed and agreeable to ambulate. No complaints with session. At EOS returned to bathroom and family in room.  Billey Chang Mobility Specialist

## 2023-05-31 NOTE — Progress Notes (Signed)
 PHARMACY - ANTICOAGULATION CONSULT NOTE  Pharmacy Consult for IV Heparin Indication: pulmonary embolus  Allergies  Allergen Reactions   Clindamycin/Lincomycin Anaphylaxis   Hydrochlorothiazide Other (See Comments)    "Made me feel like cement"- patient does not wish to take this again   Penicillins Hives, Itching and Other (See Comments)    Patient said she can tolerate Amoxicillin   Tape Rash    Patient Measurements: Height: 5\' 4"  (162.6 cm) Weight: 90.3 kg (199 lb 1.2 oz) IBW/kg (Calculated) : 54.7 Heparin dosing weight: 76 kg  Vital Signs: Temp: 97.8 F (36.6 C) (03/02 0547) Temp Source: Oral (03/02 0547) BP: 132/87 (03/02 0547) Pulse Rate: 94 (03/02 0547)  Labs: Recent Labs    05/29/23 0438 05/30/23 0629 05/31/23 0822  HGB 14.9  --   --   HCT 46.9*  --   --   PLT 293  --   --   HEPARINUNFRC 0.50 0.39 0.46  CREATININE 0.71 0.69 0.76    Estimated Creatinine Clearance: 84.4 mL/min (by C-G formula based on SCr of 0.76 mg/dL).  Assessment: 44 yoF presents with worsening SOB/chest pain; found to have bilateral segmental PEs (no RH strain noted). Pharmacy consulted to dose IV heparin  Today, 05/31/2023: Daily heparin level continues to be therapeutic with 1900 units/hr  CBC stable SCr stable < 1.0 (baseline) No s/sx of bleeding or infusion issues noted  Goal of Therapy:  Heparin level 0.3-0.7 Monitor platelets by anticoagulation protocol: Yes   Plan:  Continue heparin infusion at 1900 units/hr  Daily heparin level and CBC Monitor for s/sx of bleeding daily Follow transition to DOAC when appropriate    Hessie Knows, PharmD, BCPS Secure Chat if ?s 05/31/2023 10:15 AM

## 2023-05-31 NOTE — Progress Notes (Signed)
 GYN Oncology Progress Note  Subjective: No new complaints   Exam: BP 132/87 (BP Location: Right Arm)   Pulse 94   Temp 97.8 F (36.6 C) (Oral)   Resp 18   Ht 5\' 4"  (1.626 m)   Wt 90.3 kg   LMP 10/05/2016   SpO2 94%   BMI 34.17 kg/m  Abdomen: Normal BS. Soft, nontender, distended Extremities: No edema in bilateral lower extremities   Assessment/Plan: 58 year old female currently being admitted with bilateral PEs found on imaging along with ascites, pleural effusions, uterine mass, possible carcinomatosis. Results of work-up pending. Medical Oncology note appreciated--discharge planning.  Will continue to follow on an outpt basis.    Antionette Char, MD

## 2023-06-01 ENCOUNTER — Other Ambulatory Visit (HOSPITAL_COMMUNITY): Payer: Self-pay

## 2023-06-01 ENCOUNTER — Telehealth (HOSPITAL_COMMUNITY): Payer: Self-pay | Admitting: Pharmacy Technician

## 2023-06-01 NOTE — Telephone Encounter (Signed)
 Patient Product/process development scientist completed.    The patient is insured through [employer] [employer]. Patient has [employer], may use a copay card, and/or apply for patient assistance if available.    Ran test claim for Eliquis 5 mg and the current 30 day co-pay is $297.34 due to a deductible.   This test claim was processed through [facility]- copay amounts may vary at other pharmacies due to pharmacy/plan contracts, or as the patient moves through the different stages of their insurance plan.     Roland Earl, CPHT Pharmacy Technician III Certified Patient Advocate [facility] Pharmacy Patient Advocate Team Direct Number: [phone]  Fax: [phone]

## 2023-06-02 ENCOUNTER — Telehealth: Payer: Self-pay | Admitting: Oncology

## 2023-06-02 LAB — SURGICAL PATHOLOGY

## 2023-06-02 NOTE — Telephone Encounter (Signed)
 Left a message regarding cytology and requested a return call.

## 2023-06-03 NOTE — Telephone Encounter (Signed)
 Breslyn called back and was advised that we are waiting on her cytology results from her thoracentesis and that we will call her back when they are resulted and then work on scheduling an appointment.  She verbalized understanding and agreement.

## 2023-06-04 ENCOUNTER — Telehealth: Payer: Self-pay | Admitting: Oncology

## 2023-06-04 LAB — CYTOLOGY - NON PAP

## 2023-06-04 NOTE — Telephone Encounter (Signed)
 Called Sharon Hensley and advised her of the cytology results from her thoracenteses.  Scheduled an appointment with Dr. Bertis Ruddy on 06/05/23 at 3:00 with arrival to the cancer center at 2:30 to check in.  She verbalized understanding and agreement.

## 2023-06-05 ENCOUNTER — Encounter: Payer: Self-pay | Admitting: Hematology and Oncology

## 2023-06-05 ENCOUNTER — Inpatient Hospital Stay: Attending: Hematology and Oncology | Admitting: Hematology and Oncology

## 2023-06-05 VITALS — BP 147/91 | HR 100 | Resp 18 | Ht 64.0 in | Wt 202.6 lb

## 2023-06-05 DIAGNOSIS — Z7901 Long term (current) use of anticoagulants: Secondary | ICD-10-CM | POA: Diagnosis not present

## 2023-06-05 DIAGNOSIS — C786 Secondary malignant neoplasm of retroperitoneum and peritoneum: Secondary | ICD-10-CM | POA: Insufficient documentation

## 2023-06-05 DIAGNOSIS — J90 Pleural effusion, not elsewhere classified: Secondary | ICD-10-CM | POA: Diagnosis not present

## 2023-06-05 DIAGNOSIS — C55 Malignant neoplasm of uterus, part unspecified: Secondary | ICD-10-CM | POA: Insufficient documentation

## 2023-06-05 DIAGNOSIS — R188 Other ascites: Secondary | ICD-10-CM | POA: Insufficient documentation

## 2023-06-05 DIAGNOSIS — Z79899 Other long term (current) drug therapy: Secondary | ICD-10-CM | POA: Insufficient documentation

## 2023-06-05 DIAGNOSIS — I2699 Other pulmonary embolism without acute cor pulmonale: Secondary | ICD-10-CM | POA: Diagnosis not present

## 2023-06-05 DIAGNOSIS — Z86718 Personal history of other venous thrombosis and embolism: Secondary | ICD-10-CM

## 2023-06-05 DIAGNOSIS — B009 Herpesviral infection, unspecified: Secondary | ICD-10-CM | POA: Insufficient documentation

## 2023-06-05 DIAGNOSIS — Z87891 Personal history of nicotine dependence: Secondary | ICD-10-CM

## 2023-06-05 MED ORDER — VALACYCLOVIR HCL 1 G PO TABS: 1000.0000 mg | ORAL_TABLET | Freq: Two times a day (BID) | ORAL | 0 refills | Status: DC

## 2023-06-05 MED ORDER — DEXAMETHASONE 4 MG PO TABS: ORAL_TABLET | ORAL | 6 refills | Status: DC

## 2023-06-05 MED ORDER — LIDOCAINE-PRILOCAINE 2.5-2.5 % EX CREA: TOPICAL_CREAM | CUTANEOUS | 3 refills | Status: DC

## 2023-06-05 MED ORDER — PROCHLORPERAZINE MALEATE 10 MG PO TABS: 10.0000 mg | ORAL_TABLET | Freq: Four times a day (QID) | ORAL | 1 refills | Status: DC | PRN

## 2023-06-05 MED ORDER — ONDANSETRON HCL 8 MG PO TABS: 8.0000 mg | ORAL_TABLET | Freq: Three times a day (TID) | ORAL | 1 refills | Status: DC | PRN

## 2023-06-05 NOTE — Assessment & Plan Note (Signed)
 She is doing well on anticoagulation therapy She will continue apixaban

## 2023-06-05 NOTE — Progress Notes (Signed)
 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

## 2023-06-05 NOTE — Assessment & Plan Note (Signed)
 I explained to the patient why she is not a surgical candidate, due to advised stage IV disease as well as recent diagnosis of bilateral pulmonary emboli We discussed the importance of getting her started on systemic chemotherapy as soon as possible I will get pathologist to add additional molecular study if feasible I recommend combination of chemotherapy with carboplatin, paclitaxel and dostarlimab in neoadjuvant approach, followed by CT imaging after 3 cycles of treatment and tumor marker monitoring If she has excellent response to treatment, she would be a candidate for interval debulking surgery in the future The patient is extremely distressed today and I am not able to go through details of side effects with the treatment I will see her again next week for further follow-up and review of side effects of treatment, along with blood work and chemo education class, with plan to start her treatment on the week of March 17 I recommend port placement and she agreed

## 2023-06-05 NOTE — Assessment & Plan Note (Signed)
 She denies shortness of breath today Her examination revealed persistent pleural effusion but she is not symptomatic Monitor closely

## 2023-06-05 NOTE — Assessment & Plan Note (Signed)
 I recommend oral antiviral treatment due to her immunocompromise state-

## 2023-06-05 NOTE — Progress Notes (Signed)
 [facility] CONSULT NOTE  Patient Care Team: Gwenyth Bender, FNP as PCP - General (Family Medicine)  ASSESSMENT & PLAN:  Uterine cancer [facility]) I explained to the patient why she is not a surgical candidate, due to advised stage IV disease as well as recent diagnosis of bilateral pulmonary emboli We discussed the importance of getting her started on systemic chemotherapy as soon as possible I will get pathologist to add additional molecular study if feasible I recommend combination of chemotherapy with carboplatin, paclitaxel and dostarlimab in neoadjuvant approach, followed by CT imaging after 3 cycles of treatment and tumor marker monitoring If she has excellent response to treatment, she would be a candidate for interval debulking surgery in the future The patient is extremely distressed today and I am not able to go through details of side effects with the treatment I will see her again next week for further follow-up and review of side effects of treatment, along with blood work and chemo education class, with plan to start her treatment on the week of March 17 I recommend port placement and she agreed  Bilateral pulmonary embolism (HCC) She is doing well on anticoagulation therapy She will continue apixaban  Bilateral pleural effusion She denies shortness of breath today Her examination revealed persistent pleural effusion but she is not symptomatic Monitor closely  Orders Placed This Encounter  Procedures   IR IMAGING GUIDED PORT INSERTION    Standing Status:   Future    Expected Date:   06/12/2023    Expiration Date:   06/04/2024    Reason for Exam (SYMPTOM  OR DIAGNOSIS REQUIRED):   need port for chemo    Is the patient pregnant?:   No    Preferred Imaging Location?:   [facility]   CBC with Differential (Cancer Center Only)    Standing Status:   Future    Expected Date:   06/15/2023    Expiration Date:   06/14/2024   CMP (Cancer Center only)     Standing Status:   Future    Expected Date:   06/15/2023    Expiration Date:   06/14/2024   T4    Standing Status:   Future    Expected Date:   06/15/2023    Expiration Date:   06/14/2024   TSH    Standing Status:   Future    Expected Date:   06/15/2023    Expiration Date:   06/14/2024   CBC with Differential (Cancer Center Only)    Standing Status:   Future    Expected Date:   07/06/2023    Expiration Date:   07/05/2024   CMP (Cancer Center only)    Standing Status:   Future    Expected Date:   07/06/2023    Expiration Date:   07/05/2024   CA 125    Standing Status:   Standing    Number of Occurrences:   11    Expiration Date:   06/04/2024   CBC with Differential (Cancer Center Only)    Standing Status:   Future    Expected Date:   06/11/2023    Expiration Date:   06/04/2024   CMP (Cancer Center only)    Standing Status:   Future    Expected Date:   06/11/2023    Expiration Date:   06/04/2024    The total time spent in the appointment was 80 minutes encounter with patients including review of chart and various tests results,  discussions about plan of care and coordination of care plan   All questions were answered. The patient knows to call the clinic with any problems, questions or concerns. No barriers to learning was detected.  Artis Delay, MD 3/7/20254:53 PM  CHIEF COMPLAINTS/PURPOSE OF CONSULTATION:  Newly diagnosed metastatic uterine cancer, for neoadjuvant chemotherapy  HISTORY OF PRESENTING ILLNESS:  Sharon Hensley 58 y.o. female is here because of recent diagnosis of cancer Her domestic partner, Kennyth Arnold is present The patient has been feeling unwell for the past few months with sensation of abdominal bloating, changes in her bowel habits and poor appetite She started to experience chest pain and shortness of breath and was hospitalized a week ago with extensive workup including CT imaging, thoracentesis, endometrial biopsy as well as blood work Final report from thoracentesis revealed  malignant cells consistent with GYN primary Even though her endometrial biopsy was nonconclusive, overall presentation is highly suggestive of metastatic uterine cancer to rule lungs She denies recent postmenopausal bleeding  I have reviewed her chart and materials related to her cancer extensively and collaborated history with the patient. Summary of oncologic history is as follows: Oncology History  Uterine cancer (HCC)  05/25/2023 Imaging   DG Chest 2 View Result Date: 05/31/2023 CLINICAL DATA:  Pleural effusion, recent bilateral thoracentesis EXAM: CHEST - 2 VIEW COMPARISON:  05/29/2023 FINDINGS: Frontal and lateral views of the chest demonstrate an unremarkable cardiac silhouette. There are small bilateral pleural effusions, not appreciably changed since prior study. Areas of linear consolidation at the lung bases likely atelectasis. No pneumothorax. No acute bony abnormalities. IMPRESSION: 1. Small bilateral pleural effusions, not appreciably changed. Electronically Signed   By: Sharlet Salina M.D.   On: 05/31/2023 15:30   US THORACENTESIS ASP PLEURAL SPACE W/IMG GUIDE Result Date: 05/29/2023 INDICATION: 58 year old female with uterine mass concerning for malignancy, pulmonary embolus, bilateral pleural effusion for left therapeutic and diagnostic thoracentesis. EXAM: ULTRASOUND GUIDED LEFT THORACENTESIS MEDICATIONS: 10 mL 1% lidocaine COMPLICATIONS: None immediate. PROCEDURE: An ultrasound guided thoracentesis was thoroughly discussed with the patient and questions answered. The benefits, risks, alternatives and complications were also discussed. The patient understands and wishes to proceed with the procedure. Written consent was obtained. Ultrasound was performed to localize and mark an adequate pocket of fluid in the left chest. The area was then prepped and draped in the normal sterile fashion. 1% Lidocaine was used for local anesthesia. Under ultrasound guidance a 8 Fr Safe-T-Centesis catheter  was introduced. Thoracentesis was performed. The catheter was removed and a dressing applied. FINDINGS: A total of approximately 750 mL of clear yellow fluid was removed. Samples were sent to the laboratory as requested by the clinical team. IMPRESSION: Successful ultrasound guided left thoracentesis yielding 750 mL of pleural fluid. Performed By Theresa Mulligan, PA-C Electronically Signed   By: Gilmer Mor D.O.   On: 05/29/2023 15:18   DG Chest 1 View Result Date: 05/29/2023 CLINICAL DATA:  Left thoracentesis EXAM: CHEST  1 VIEW COMPARISON:  05/28/2023 FINDINGS: Small bilateral pleural effusions with associated bibasilar opacities. Left-sided effusion has decreased following thoracentesis. No pneumothorax. Stable heart size. IMPRESSION: Small bilateral pleural effusions with associated bibasilar opacities. Left-sided effusion has decreased following thoracentesis. No pneumothorax. Electronically Signed   By: Duanne Guess D.O.   On: 05/29/2023 15:00   DG CHEST PORT 1 VIEW Result Date: 05/28/2023 CLINICAL DATA:  Dyspnea, bilateral pulmonary emboli EXAM: PORTABLE CHEST 1 VIEW COMPARISON:  05/28/2023 FINDINGS: Single frontal view of the chest demonstrates a stable cardiac  silhouette. There are progressive bilateral veiling opacities, left greater than right. No pneumothorax. No acute bony abnormalities. IMPRESSION: 1. Progressive bibasilar veiling opacities consistent with worsening bibasilar consolidation and enlarging effusions. Electronically Signed   By: Sharlet Salina M.D.   On: 05/28/2023 20:41   DG Chest 1 View Result Date: 05/28/2023 CLINICAL DATA:  Status post right thoracentesis. EXAM: CHEST  1 VIEW COMPARISON:  Chest radiographs 05/28/2023, 224 2025 FINDINGS: There is improvement in the prior layering right pleural effusion. Unchanged mild-to-moderate left pleural effusion. Thickening of right minor fissure from tracking fluid is similar to prior. No pneumothorax is seen. The visualized cardiac  silhouette and mediastinal contours are within normal limits. Moderate multilevel degenerative disc changes of the thoracic spine. IMPRESSION: 1. Improvement in the prior layering right pleural effusion following interval thoracentesis. No pneumothorax. 2. Unchanged mild-to-moderate left pleural effusion. Electronically Signed   By: Neita Garnet M.D.   On: 05/28/2023 14:50   [place] ASCITES (ABDOMEN LIMITED) Result Date: 05/28/2023 INDICATION: 58 year old female with uterine mass concerning for malignancy with ascites for diagnostic paracentesis. EXAM: ULTRASOUND ABDOMEN LIMITED FINDINGS: Imaging of all 4 quadrants of the abdomen reveal trace ascites IMPRESSION: Small amount of ascites seen on ultrasound. After discussion of the risks versus benefits of the procedure the patient decided to defer paracentesis at this time. Performed By Theresa Mulligan, PA-C Electronically Signed   By: Gilmer Mor D.O.   On: 05/28/2023 14:31   US THORACENTESIS ASP PLEURAL SPACE W/IMG GUIDE Result Date: 05/28/2023 INDICATION: 58 year old female with uterine mass concerning for maliganancy, pulmonary embolus, bilateral pleural effusion for right therapeautic and diagnostic thoracentesis. EXAM: ULTRASOUND GUIDED RIGHT THORACENTESIS MEDICATIONS: 12 mL 1% lidocaine COMPLICATIONS: None immediate. PROCEDURE: An ultrasound guided thoracentesis was thoroughly discussed with the patient and questions answered. The benefits, risks, alternatives and complications were also discussed. The patient understands and wishes to proceed with the procedure. Written consent was obtained. Ultrasound was performed to localize and mark an adequate pocket of fluid in the right chest. The area was then prepped and draped in the normal sterile fashion. 1% Lidocaine was used for local anesthesia. Under ultrasound guidance a 8 Fr Safe-T-Centesis catheter was introduced. Thoracentesis was performed. The catheter was removed and a dressing applied. FINDINGS: A  total of approximately 920 mL of clear red fluid was removed. Samples were sent to the laboratory as requested by the clinical team. IMPRESSION: Successful ultrasound guided right thoracentesis yielding 920 mL of pleural fluid. Performed By Theresa Mulligan, PA-C Electronically Signed   By: Gilmer Mor D.O.   On: 05/28/2023 14:31   DG Chest 2 View Result Date: 05/28/2023 CLINICAL DATA:  Pleural effusion. EXAM: CHEST - 2 VIEW COMPARISON:  May 25, 2023. FINDINGS: Stable cardiomediastinal silhouette. Stable bilateral pleural effusions with associated bibasilar atelectasis. Bony thorax is unremarkable. IMPRESSION: Stable bilateral pleural effusions with associated bibasilar atelectasis. Electronically Signed   By: Lupita Raider M.D.   On: 05/28/2023 09:50   MR PELVIS WO CONTRAST Result Date: 05/26/2023 CLINICAL DATA:  Uterine mass (fibroid versus sarcoma), concern for metastatic disease EXAM: MRI PELVIS WITHOUT CONTRAST TECHNIQUE: Multiplanar multisequence MR imaging of the pelvis was performed. No intravenous contrast was administered. COMPARISON:  CT abdomen/pelvis dated 05/25/2023. FINDINGS: Urinary Tract:  Bladder is underdistended but unremarkable. Bowel:  Visualized bowel is grossly unremarkable. Vascular/Lymphatic: No evidence of aneurysm. Small bilateral pelvic sidewall nodes measuring up to 10-11 mm short axis (series 4/image 25), suspicious. Reproductive: Uterus is notable for multiple uterine  fibroids (series 4/images 21, 24, and 26), including a dominant 13.3 x 11.4 x 11.8 cm intramural fibroid in the left anterior uterine body. Suspected right ovary is within normal limits (series 4/image 19). Left ovary is not discretely visualized. Other:  Moderate pelvic ascites. Suspected abnormal peritoneal soft tissue/omental caking beneath the anterior abdominal wall (series 7/images 3 and 5), mostly excluded on this pelvic MR. Musculoskeletal: No focal osseous lesions. IMPRESSION: Dominant 13.3 cm  intramural fibroid in the left anterior uterine body. While uterine leiomyosarcoma cannot be excluded by imaging, the lesion does not demonstrate suspicious imaging findings by MR. Moderate pelvic ascites with suspected peritoneal disease/omental caking beneath the anterior abdominal wall, incompletely visualized. Mild pelvic lymphadenopathy, suspicious for nodal metastases. Assuming this does in fact reflect a benign fibroid, the underlying site of primary malignancy is not evident on this study. Electronically Signed   By: Charline Bills M.D.   On: 05/26/2023 22:08   VAS [place] LOWER EXTREMITY VENOUS (DVT) Result Date: 05/26/2023  Lower Venous DVT Study Patient Name:  Sharon Hensley  Date of Exam:   05/26/2023 Medical Rec #: [id number]   Accession #:    [id number] Date of Birth: 1966-02-26   Patient Gender: F Patient Age:   63 years Exam Location:  [facility] Procedure:      VAS [place] LOWER EXTREMITY VENOUS (DVT) Referring Phys: A POWELL JR --------------------------------------------------------------------------------  Indications: Edema, and pulmonary embolism.  Risk Factors: Confirmed PE. Anticoagulation: Heparin. Comparison Study: No prior studies. Performing Technologist: Chanda Busing RVT  Examination Guidelines: A complete evaluation includes B-mode imaging, spectral Doppler, color Doppler, and power Doppler as needed of all accessible portions of each vessel. Bilateral testing is considered an integral part of a complete examination. Limited examinations for reoccurring indications may be performed as noted. The reflux portion of the exam is performed with the patient in reverse Trendelenburg.  +---------+---------------+---------+-----------+----------+--------------+ RIGHT    CompressibilityPhasicitySpontaneityPropertiesThrombus Aging +---------+---------------+---------+-----------+----------+--------------+ CFV      Full           Yes      Yes                                  +---------+---------------+---------+-----------+----------+--------------+ SFJ      Full                                                        +---------+---------------+---------+-----------+----------+--------------+ FV Prox  Full                                                        +---------+---------------+---------+-----------+----------+--------------+ FV Mid   Full                                                        +---------+---------------+---------+-----------+----------+--------------+ FV DistalPartial        Yes      Yes  Acute          +---------+---------------+---------+-----------+----------+--------------+ PFV      Full                                                        +---------+---------------+---------+-----------+----------+--------------+ POP      Partial        Yes      Yes                  Acute          +---------+---------------+---------+-----------+----------+--------------+ PTV      None                                         Acute          +---------+---------------+---------+-----------+----------+--------------+ PERO     Partial                                      Acute          +---------+---------------+---------+-----------+----------+--------------+ Gastroc  None                                         Acute          +---------+---------------+---------+-----------+----------+--------------+ SSV      None                                         Acute          +---------+---------------+---------+-----------+----------+--------------+   +---------+---------------+---------+-----------+----------+--------------+ LEFT     CompressibilityPhasicitySpontaneityPropertiesThrombus Aging +---------+---------------+---------+-----------+----------+--------------+ CFV      Full           Yes      Yes                                  +---------+---------------+---------+-----------+----------+--------------+ SFJ      Full                                                        +---------+---------------+---------+-----------+----------+--------------+ FV Prox  Full                                                        +---------+---------------+---------+-----------+----------+--------------+ FV Mid   Full                                                        +---------+---------------+---------+-----------+----------+--------------+  FV DistalFull                                                        +---------+---------------+---------+-----------+----------+--------------+ PFV      Full                                                        +---------+---------------+---------+-----------+----------+--------------+ POP      None           No       No                   Acute          +---------+---------------+---------+-----------+----------+--------------+ PTV      None                                         Acute          +---------+---------------+---------+-----------+----------+--------------+ PERO     None                                         Acute          +---------+---------------+---------+-----------+----------+--------------+ Gastroc  None                                         Acute          +---------+---------------+---------+-----------+----------+--------------+ SSV      None                                         Acute          +---------+---------------+---------+-----------+----------+--------------+     Summary: RIGHT: - Findings consistent with acute deep vein thrombosis involving the right femoral vein, right popliteal vein, right posterior tibial veins, and right peroneal veins. - Findings consistent with acute superficial vein thrombosis involving the right small saphenous vein. Findings consistent with acute intramuscular thrombosis  involving the right gastrocnemius veins. - No cystic structure found in the popliteal fossa.  LEFT: - Findings consistent with acute deep vein thrombosis involving the left popliteal vein, left posterior tibial veins, and left peroneal veins. - Findings consistent with acute superficial vein thrombosis involving the left small saphenous vein. Findings consistent with acute intramuscular thrombosis involving the left gastrocnemius veins. - No cystic structure found in the popliteal fossa.  *See table(s) above for measurements and observations. Electronically signed by Lemar Livings MD on 05/26/2023 at 3:29:25 PM.    Final    ECHOCARDIOGRAM COMPLETE Result Date: 05/26/2023    ECHOCARDIOGRAM REPORT   Patient Name:   LEANE LORING Date of Exam: 05/26/2023 Medical Rec #:  [id number]  Height:       64.0 in Accession #:    [id number] Weight:  209.0 lb Date of Birth:  [birth year]  BSA:          1.993 m Patient Age:    57 years   BP:           122/70 mmHg Patient Gender: F          HR:           94 bpm. Exam Location:  Inpatient Procedure: 2D Echo, Color Doppler and Cardiac Doppler (Both Spectral and Color            Flow Doppler were utilized during procedure). Indications:    Pulmonary Embolus I26.09  History:        Patient has no prior history of Echocardiogram examinations.  Sonographer:    Harriette Bouillon RDCS Referring Phys: [id number] A CALDWELL POWELL JR IMPRESSIONS  1. Left ventricular ejection fraction, by estimation, is 60 to 65%. The left ventricle has normal function. The left ventricle has no regional wall motion abnormalities. There is mild concentric left ventricular hypertrophy. Left ventricular diastolic parameters are consistent with Grade I diastolic dysfunction (impaired relaxation).  2. Right ventricular systolic function is normal. The right ventricular size is normal. Tricuspid regurgitation signal is inadequate for assessing PA pressure.  3. The mitral valve is normal in structure. No evidence of mitral  valve regurgitation. No evidence of mitral stenosis.  4. The aortic valve is tricuspid. Aortic valve regurgitation is not visualized. No aortic stenosis is present.  5. The inferior vena cava is normal in size with greater than 50% respiratory variability, suggesting right atrial pressure of 3 mmHg.  6. Left pleural effusion present. FINDINGS  Left Ventricle: Left ventricular ejection fraction, by estimation, is 60 to 65%. The left ventricle has normal function. The left ventricle has no regional wall motion abnormalities. Strain imaging was not performed. The left ventricular internal cavity  size was normal in size. There is mild concentric left ventricular hypertrophy. Left ventricular diastolic parameters are consistent with Grade I diastolic dysfunction (impaired relaxation). Right Ventricle: The right ventricular size is normal. No increase in right ventricular wall thickness. Right ventricular systolic function is normal. Tricuspid regurgitation signal is inadequate for assessing PA pressure. Left Atrium: Left atrial size was normal in size. Right Atrium: Right atrial size was normal in size. Pericardium: Left pleural effusion present. There is no evidence of pericardial effusion. Mitral Valve: The mitral valve is normal in structure. No evidence of mitral valve regurgitation. No evidence of mitral valve stenosis. Tricuspid Valve: The tricuspid valve is normal in structure. Tricuspid valve regurgitation is not demonstrated. Aortic Valve: The aortic valve is tricuspid. Aortic valve regurgitation is not visualized. No aortic stenosis is present. Pulmonic Valve: The pulmonic valve was normal in structure. Pulmonic valve regurgitation is not visualized. Aorta: The aortic root is normal in size and structure. Venous: The inferior vena cava is normal in size with greater than 50% respiratory variability, suggesting right atrial pressure of 3 mmHg. IAS/Shunts: No atrial level shunt detected by color flow Doppler.  Additional Comments: 3D imaging was not performed.  LEFT VENTRICLE PLAX 2D LVIDd:         3.60 cm   Diastology LVIDs:         2.30 cm   LV e' medial:    4.68 cm/s LV PW:         1.20 cm   LV E/e' medial:  18.5 LV IVS:        1.20 cm   LV e' lateral:   9.25  cm/s LVOT diam:     2.00 cm   LV E/e' lateral: 9.3 LV SV:         67 LV SV Index:   34 LVOT Area:     3.14 cm  RIGHT VENTRICLE             IVC RV S prime:     10.80 cm/s  IVC diam: 1.60 cm TAPSE (M-mode): 1.7 cm LEFT ATRIUM             Index        RIGHT ATRIUM           Index LA diam:        2.60 cm 1.30 cm/m   RA Area:     13.40 cm LA Vol (A2C):   45.9 ml 23.03 ml/m  RA Volume:   32.00 ml  16.05 ml/m LA Vol (A4C):   32.3 ml 16.20 ml/m LA Biplane Vol: 41.5 ml 20.82 ml/m  AORTIC VALVE LVOT Vmax:   129.00 cm/s LVOT Vmean:  83.900 cm/s LVOT VTI:    0.213 m  AORTA Ao Root diam: 2.70 cm Ao Asc diam:  3.60 cm MITRAL VALVE MV Area (PHT): 6.54 cm     SHUNTS MV Decel Time: 116 msec     Systemic VTI:  0.21 m MV E velocity: 86.40 cm/s   Systemic Diam: 2.00 cm MV A velocity: 114.00 cm/s MV E/A ratio:  0.76 Dalton McleanMD Electronically signed by Wilfred Lacy Signature Date/Time: 05/26/2023/12:50:45 PM    Final    CT Angio Chest PE W/Cm &/Or Wo Cm Result Date: 05/25/2023 CLINICAL DATA:  Intermittent shortness of breath for 2 weeks, left-sided chest pain EXAM: CT ANGIOGRAPHY CHEST WITH CONTRAST TECHNIQUE: Multidetector CT imaging of the chest was performed using the standard protocol during bolus administration of intravenous contrast. Multiplanar CT image reconstructions and MIPs were obtained to evaluate the vascular anatomy. RADIATION DOSE REDUCTION: This exam was performed according to the departmental dose-optimization program which includes automated exposure control, adjustment of the mA and/or kV according to patient size and/or use of iterative reconstruction technique. CONTRAST:  OMNIPAQUE IOHEXOL 350 MG/ML SOLN COMPARISON:  05/25/2023 FINDINGS:  Cardiovascular: This is a technically adequate evaluation of the pulmonary vasculature. There are bilateral pulmonary emboli, primarily within the segmental vessels of the left upper, right upper, and right lower lobes. Mild to moderate clot burden. No evidence of right heart strain. The heart is unremarkable without pericardial effusion. No evidence of thoracic aortic aneurysm or dissection. Atherosclerosis of the aorta and coronary vasculature. Mediastinum/Nodes: Nonspecific subcentimeter lymph nodes throughout the mediastinum and hilar regions. Thyroid, trachea, and esophagus are grossly unremarkable. Lungs/Pleura: Moderate bilateral pleural effusions, right greater than left, with significant compressive atelectasis within the bilateral lower lobes. There is also complete consolidation with volume loss in the right middle lobe compatible with atelectasis. This is of uncertain chronicity. No evidence of central obstructing mass. No airspace disease or pneumothorax.  Central airways are patent. Upper Abdomen: Small volume ascites.  Multiple hepatic cysts. Musculoskeletal: No acute or destructive bony abnormalities. Reconstructed images demonstrate no additional findings. Review of the MIP images confirms the above findings. IMPRESSION: 1. Bilateral segmental pulmonary emboli. Mild moderate clot burden with no evidence of right heart strain. 2. Moderate bilateral pleural effusions, with extensive compressive atelectasis throughout the lower lobes. 3. Complete atelectasis of the right middle lobe, of uncertain chronicity. No central obstructing lesion identified. 4. Numerous subcentimeter mediastinal and hilar lymph nodes, nonspecific. No pathologic  adenopathy. 5. Upper abdominal ascites. 6. Aortic Atherosclerosis (ICD10-I70.0). Coronary artery atherosclerosis. Critical Value/emergent results were called by telephone at the time of interpretation on 05/25/2023 at 3:46 pm to provider [facility] , who verbally  acknowledged these results. Electronically Signed   By: Sharlet Salina M.D.   On: 05/25/2023 15:54   CT ABDOMEN PELVIS W CONTRAST Result Date: 05/25/2023 CLINICAL DATA:  Abdominal pain, acute, nonlocalized. EXAM: CT ABDOMEN AND PELVIS WITH CONTRAST TECHNIQUE: Multidetector CT imaging of the abdomen and pelvis was performed using the standard protocol following bolus administration of intravenous contrast. RADIATION DOSE REDUCTION: This exam was performed according to the departmental dose-optimization program which includes automated exposure control, adjustment of the mA and/or kV according to patient size and/or use of iterative reconstruction technique. CONTRAST:  OMNIPAQUE IOHEXOL 350 MG/ML SOLN COMPARISON:  CT scan abdomen and pelvis from 04/01/2003. FINDINGS: Lower chest: Bilateral moderate to large pleural effusions noted with associated compressive atelectatic changes. The heart is normal in size. No pericardial effusion. Hepatobiliary: The liver is normal in size. Non-cirrhotic configuration. There are at least 2 simple cysts in the liver with largest measuring up to 3.3 x 3.4 cm. The cysts are present since the prior study dating back to 2005 and exhibits significant interval growth. No intrahepatic or extrahepatic bile duct dilation. Gallbladder is surgically absent. Pancreas: Unremarkable. No pancreatic ductal dilatation or surrounding inflammatory changes. Spleen: Within normal limits. No focal lesion. Adrenals/Urinary Tract: Adrenal glands are unremarkable. No suspicious renal mass. No hydronephrosis. No renal or ureteric calculi. Urinary bladder is under distended, precluding optimal assessment. However, no large mass or stones identified. No perivesical fat stranding. Stomach/Bowel: No disproportionate dilation of the small or large bowel loops. No evidence of abnormal bowel wall thickening or inflammatory changes. The appendix is unremarkable. Vascular/Lymphatic: There is small-to-moderate  ascites mainly in the perihepatic/perisplenic region, dependent pelvis and bilateral paracolic gutters. There are soft tissue nodularity/stranding in the upper abdomen, which is nonspecific but can be seen with ascites or due to peritoneal carcinomatosis. No pneumoperitoneum. There are mildly enlarged retroperitoneal lymph nodes, which are indeterminate in etiology. No aneurysmal dilation of the major abdominal arteries. There are mild peripheral atherosclerotic vascular calcifications of the aorta and its major branches. Reproductive: The uterus is enlarged secondary to a markedly enlarged 12.9 x 13.7 cm heterogeneous hypoattenuating area in the fundus/uterine body region. The uterus is probably pushed to the right and inferiorly however, not well evaluated. Endometrium is not seen. Bilateral ovaries are not distinctly visualized on this exam however, no large adnexal mass seen. Other: There is a small fat containing umbilical hernia. The soft tissues and abdominal wall are otherwise unremarkable. Musculoskeletal: No suspicious osseous lesions. There are mild multilevel degenerative changes in the visualized spine. IMPRESSION: 1. Enlarged uterus secondary to a large heterogeneous hypoattenuating area in the fundus/uterine body region. This is not well evaluated on the current exam. Differential diagnosis includes benign versus malignant uterine mass. Correlate clinically. Consider additional imaging as indicated. 2. There is small-to-moderate ascites. There is also soft tissue nodularity/stranding in the upper abdomen, which is nonspecific but can be seen with ascites or due to peritoneal carcinomatosis. 3. Bilateral moderate to large pleural effusions with associated compressive atelectatic changes. 4. Multiple other nonacute observations, as described above. Aortic Atherosclerosis (ICD10-I70.0). Electronically Signed   By: Jules Schick M.D.   On: 05/25/2023 15:44   DG Chest Port 1 View Result Date:  05/25/2023 CLINICAL DATA:  Shortness of breath. EXAM: PORTABLE CHEST 1 VIEW  COMPARISON:  07/20/2013. FINDINGS: Low lung volume. Mild diffuse pulmonary vascular congestion, likely accentuated by low lung volume. No frank pulmonary edema. There are bilateral small-to-moderate pleural effusions with probable underlying compressive atelectatic changes. No pneumothorax. Normal cardio-mediastinal silhouette. No acute osseous abnormalities. The soft tissues are within normal limits. IMPRESSION: Findings concerning for congestive heart failure/pulmonary edema. Correlate clinically. Electronically Signed   By: Jules Schick M.D.   On: 05/25/2023 12:28      05/26/2023 Pathology Results   SURGICAL PATHOLOGY  CASE: [id number]  PATIENT: Sharon Hensley  Surgical Pathology Report   Reason for Addendum #1:  Immunohistochemistry results   Clinical History: None provided   FINAL MICROSCOPIC DIAGNOSIS:   A. ENDOMETRIUM, BIOPSY:       Highly atypical glandular cells.       See comment.  COMMENT:  The specimen contains scant superficially sampled atypical glandular cells characterized by nuclear enlargement, hyperchromasia, variation in cell size and irregular nuclear membrane. The cells also contain abundant cytoplasm. Immunohistochemical stains show the cells are positive for Vimentin, and negative for CEA and PR. ER shows focal dim positivity. P16 does show positive pattern of staining. The overall profile favors that the cells are endometrium in nature, however endocervical origin cannot be excluded. The cells are very scant and disrupted for which additional sampling is recommended to a definitive diagnosis.     05/26/2023 Tumor Marker   Patient's tumor was tested for the following markers: CA-125. Results of the tumor marker test revealed 1092.   05/28/2023 Pathology Results   CYTOLOGY - NON PAP  CASE: [id number]  PATIENT: Sharon Hensley  Non-Gynecological Cytology Report      Clinical History: SOB   Specimen Submitted:  A. PLEURAL FLUID, RIGHT, THORACENTESIS:    FINAL MICROSCOPIC DIAGNOSIS:  - Adenocarcinoma  - Favor from gynecologic system   SPECIMEN ADEQUACY:  Satisfactory for evaluation   DIAGNOSTIC COMMENTS:  Immunohistochemical stains show the cells are positive for MOC31, PAX8 and vimentin. They are negative for calretinin, WT1, D2-40, CK5/6 and ER. The findings are in keeping with an adenocarcinoma favoring from gynecologic system. Controls worked appropriately.     06/05/2023 Initial Diagnosis   Uterine cancer (HCC)   06/15/2023 -  Chemotherapy   Patient is on Treatment Plan : UTERINE ENDOMETRIAL Dostarlimab-gxly (500 mg) + Carboplatin (AUC 5) + Paclitaxel (175 mg/m2) q21d x 6 cycles / Dostarlimab-gxly (1000 mg) q42d x 6 cycles        MEDICAL HISTORY:  Past Medical History:  Diagnosis Date   Anemia    Goiter    Temporary low platelet count (HCC)     SURGICAL HISTORY: Past Surgical History:  Procedure Laterality Date   CHOLECYSTECTOMY      SOCIAL HISTORY: Social History   Socioeconomic History   Marital status: Media planner    Spouse name: Not on file   Number of children: Not on file   Years of education: Not on file   Highest education level: Not on file  Occupational History   Not on file  Tobacco Use   Smoking status: Former    Types: Cigarettes   Smokeless tobacco: Never  Vaping Use   Vaping status: Never Used  Substance and Sexual Activity   Alcohol use: No   Drug use: No   Sexual activity: Yes  Other Topics Concern   Not on file  Social History Narrative   Not on file   Social Drivers of Health   Financial Resource Strain: Not on file  Food Insecurity: No Food Insecurity (05/26/2023)   Hunger Vital Sign    Worried About Running Out of Food in the Last Year: Never true    Ran Out of Food in the Last Year: Never true  Transportation Needs: No Transportation Needs (05/26/2023)   PRAPARE - Administrator, Civil Service  (Medical): No    Lack of Transportation (Non-Medical): No  Physical Activity: Not on file  Stress: Not on file  Social Connections: Not on file  Intimate Partner Violence: Not At Risk (05/26/2023)   Humiliation, Afraid, Rape, and Kick questionnaire    Fear of Current or Ex-Partner: No    Emotionally Abused: No    Physically Abused: No    Sexually Abused: No    FAMILY HISTORY: Family History  Problem Relation Age of Onset   Diabetes Mother    Diabetes Other     ALLERGIES:  is allergic to clindamycin/lincomycin, hydrochlorothiazide, penicillins, and tape.  MEDICATIONS:  Current Outpatient Medications  Medication Sig Dispense Refill   dexamethasone (DECADRON) 4 MG tablet Take 2 tabs at the night before and 2 tab the morning of chemotherapy, every 3 weeks, by mouth x 6 cycles 24 tablet 6   valACYclovir (VALTREX) 1000 MG tablet Take 1 tablet (1,000 mg total) by mouth 2 (two) times daily. 20 tablet 0   Acetaminophen Extra Strength 500 MG CAPS Take 500-1,000 mg by mouth See admin instructions. Tylenol Rapid Release 500 mg capsules- Take 500-1,000 mg by mouth every 8 hours as needed for headaches or pain     ADVIL 200 MG CAPS Take 200-400 mg by mouth every 8 (eight) hours as needed (for pain or headaches).     APIXABAN (ELIQUIS) VTE STARTER PACK (10MG  AND 5MG ) Take as directed on package: start with two-5mg  tablets twice daily for 7 days. On day 8, switch to one-5mg  tablet twice daily. 74 each 0   famotidine-calcium carbonate-magnesium hydroxide (PEPCID COMPLETE) 10-800-165 MG CHEW chewable tablet Chew 1 tablet by mouth at bedtime as needed (heartburn).     furosemide (LASIX) 20 MG tablet Take 3 tablets (60 mg total) by mouth 2 (two) times daily. 180 tablet 3   levothyroxine (SYNTHROID) 150 MCG tablet Take 150 mcg by mouth See admin instructions. Take 150 mcg by mouth in the morning 30 minutes before breakfast on Mon/Tues/Wed/Thurs/Fri/Sat- skip Sundays     lidocaine-prilocaine (EMLA) cream  Apply to affected area once 30 g 3   Misc Natural Products (YUMVS BEET ROOT-TART CHERRY) 250-0.5 MG CHEW Chew 2 tablets by mouth daily.     ondansetron (ZOFRAN) 8 MG tablet Take 1 tablet (8 mg total) by mouth every 8 (eight) hours as needed for nausea or vomiting. Start on the third day after chemotherapy. 30 tablet 1   oxymetazoline (AFRIN) 0.05 % nasal spray Place 1 spray into both nostrils 2 (two) times daily as needed for congestion.     prochlorperazine (COMPAZINE) 10 MG tablet Take 1 tablet (10 mg total) by mouth every 6 (six) hours as needed for nausea or vomiting. 30 tablet 1   No current facility-administered medications for this visit.    REVIEW OF SYSTEMS: She developed fever blisters affecting her right upper lip 2 days ago Constitutional: Denies fevers, chills or abnormal night sweats Eyes: Denies blurriness of vision, double vision or watery eyes Ears, nose, mouth, throat, and face: Denies mucositis or sore throat Respiratory: Denies cough, dyspnea or wheezes Cardiovascular: Denies palpitation, chest discomfort or lower extremity swelling Gastrointestinal:  Denies  nausea, heartburn or change in bowel habits Lymphatics: Denies new lymphadenopathy or easy bruising Neurological:Denies numbness, tingling or new weaknesses Behavioral/Psych: Mood is stable, no new changes  All other systems were reviewed with the patient and are negative.  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 EYES: normal, conjunctiva are pink and non-injected, sclera clear OROPHARYNX:no exudate, no erythema and lips, buccal mucosa, and tongue normal  NECK: supple, thyroid normal size, non-tender, without  nodularity LYMPH:  no palpable lymphadenopathy in the cervical, axillary or inguinal LUNGS: Reduced breath sound bilateral lungs HEART: regular rate & rhythm and no murmurs and no lower extremity edema ABDOMEN:abdomen soft, distended with ascites with palpable suprapubic mass Musculoskeletal:no cyanosis of digits and no clubbing  PSYCH: alert & oriented x 3 with fluent speech NEURO: no focal motor/sensory deficits  LABORATORY DATA:  I have reviewed the data as listed Lab Results  Component Value Date   WBC 8.3 05/29/2023   HGB 14.9 05/29/2023   HCT 46.9 (H) 05/29/2023   MCV 91.4 05/29/2023   PLT 293 05/29/2023   Recent Labs    05/25/23 1341 05/26/23 0525 05/27/23 0455 05/29/23 0438 05/30/23 0629 05/31/23 0822  NA  --  143   < > 135 136 133*  K  --  3.7   < > 3.6 4.0 4.2  CL  --  104   < > 95* 95* 89*  CO2  --  28   < > 32 33* 32  GLUCOSE  --  152*   < > 135* 132* 204*  BUN  --  9   < > 13 13 14   CREATININE  --  0.68   < > 0.71 0.69 0.76  CALCIUM  --  10.3   < > 10.7* 10.7* 11.5*  GFRNONAA  --  >60   < > >60 >60 >60  PROT 7.3 6.8  --  6.7  --   --   ALBUMIN 3.9 3.6  --  3.4*  --   --   AST 27 19  --  19  --   --   ALT 29 27  --  35  --   --   ALKPHOS 105 102  --  100  --   --   BILITOT 0.6 0.5  --  0.5  --   --   BILIDIR <0.1  --   --   --   --   --   IBILI NOT CALCULATED  --   --   --   --   --    < > = values in this interval not displayed.    RADIOGRAPHIC STUDIES: I have personally reviewed the radiological images as listed and agreed with the findings in the report. DG Chest 2 View Result Date: 05/31/2023 CLINICAL DATA:  Pleural effusion, recent bilateral thoracentesis EXAM: CHEST - 2 VIEW COMPARISON:  05/29/2023 FINDINGS: Frontal and lateral views of the chest demonstrate an unremarkable cardiac silhouette. There are small bilateral pleural effusions, not appreciably changed since prior study. Areas of linear consolidation at the lung bases likely atelectasis. No  pneumothorax. No acute bony abnormalities. IMPRESSION: 1. Small bilateral pleural effusions, not appreciably  changed. Electronically Signed   By: Sharlet Salina M.D.   On: 05/31/2023 15:30   US THORACENTESIS ASP PLEURAL SPACE W/IMG GUIDE Result Date: 05/29/2023 INDICATION: 58 year old female with uterine mass concerning for malignancy, pulmonary embolus, bilateral pleural effusion for left therapeutic and diagnostic thoracentesis. EXAM: ULTRASOUND GUIDED LEFT THORACENTESIS MEDICATIONS: 10 mL 1% lidocaine COMPLICATIONS: None immediate. PROCEDURE: An ultrasound guided thoracentesis was thoroughly discussed with the patient and questions answered. The benefits, risks, alternatives and complications were also discussed. The patient understands and wishes to proceed with the procedure. Written consent was obtained. Ultrasound was performed to localize and mark an adequate pocket of fluid in the left chest. The area was then prepped and draped in the normal sterile fashion. 1% Lidocaine was used for local anesthesia. Under ultrasound guidance a 8 Fr Safe-T-Centesis catheter was introduced. Thoracentesis was performed. The catheter was removed and a dressing applied. FINDINGS: A total of approximately 750 mL of clear yellow fluid was removed. Samples were sent to the laboratory as requested by the clinical team. IMPRESSION: Successful ultrasound guided left thoracentesis yielding 750 mL of pleural fluid. Performed By Theresa Mulligan, PA-C Electronically Signed   By: Gilmer Mor D.O.   On: 05/29/2023 15:18   DG Chest 1 View Result Date: 05/29/2023 CLINICAL DATA:  Left thoracentesis EXAM: CHEST  1 VIEW COMPARISON:  05/28/2023 FINDINGS: Small bilateral pleural effusions with associated bibasilar opacities. Left-sided effusion has decreased following thoracentesis. No pneumothorax. Stable heart size. IMPRESSION: Small bilateral pleural effusions with associated bibasilar opacities. Left-sided effusion has decreased  following thoracentesis. No pneumothorax. Electronically Signed   By: Duanne Guess D.O.   On: 05/29/2023 15:00   DG CHEST PORT 1 VIEW Result Date: 05/28/2023 CLINICAL DATA:  Dyspnea, bilateral pulmonary emboli EXAM: PORTABLE CHEST 1 VIEW COMPARISON:  05/28/2023 FINDINGS: Single frontal view of the chest demonstrates a stable cardiac silhouette. There are progressive bilateral veiling opacities, left greater than right. No pneumothorax. No acute bony abnormalities. IMPRESSION: 1. Progressive bibasilar veiling opacities consistent with worsening bibasilar consolidation and enlarging effusions. Electronically Signed   By: Sharlet Salina M.D.   On: 05/28/2023 20:41   DG Chest 1 View Result Date: 05/28/2023 CLINICAL DATA:  Status post right thoracentesis. EXAM: CHEST  1 VIEW COMPARISON:  Chest radiographs 05/28/2023, 224 2025 FINDINGS: There is improvement in the prior layering right pleural effusion. Unchanged mild-to-moderate left pleural effusion. Thickening of right minor fissure from tracking fluid is similar to prior. No pneumothorax is seen. The visualized cardiac silhouette and mediastinal contours are within normal limits. Moderate multilevel degenerative disc changes of the thoracic spine. IMPRESSION: 1. Improvement in the prior layering right pleural effusion following interval thoracentesis. No pneumothorax. 2. Unchanged mild-to-moderate left pleural effusion. Electronically Signed   By: Neita Garnet M.D.   On: 05/28/2023 14:50   [place] ASCITES (ABDOMEN LIMITED) Result Date: 05/28/2023 INDICATION: 58 year old female with uterine mass concerning for malignancy with ascites for diagnostic paracentesis. EXAM: ULTRASOUND ABDOMEN LIMITED FINDINGS: Imaging of all 4 quadrants of the abdomen reveal trace ascites IMPRESSION: Small amount of ascites seen on ultrasound. After discussion of the risks versus benefits of the procedure the patient decided to defer paracentesis at this time. Performed By Theresa Mulligan, PA-C Electronically Signed   By: Gilmer Mor D.O.   On: 05/28/2023 14:31   US THORACENTESIS ASP PLEURAL SPACE W/IMG GUIDE Result Date: 05/28/2023 INDICATION: 58 year old female with uterine mass concerning for maliganancy, pulmonary embolus, bilateral pleural effusion for right therapeautic  and diagnostic thoracentesis. EXAM: ULTRASOUND GUIDED RIGHT THORACENTESIS MEDICATIONS: 12 mL 1% lidocaine COMPLICATIONS: None immediate. PROCEDURE: An ultrasound guided thoracentesis was thoroughly discussed with the patient and questions answered. The benefits, risks, alternatives and complications were also discussed. The patient understands and wishes to proceed with the procedure. Written consent was obtained. Ultrasound was performed to localize and mark an adequate pocket of fluid in the right chest. The area was then prepped and draped in the normal sterile fashion. 1% Lidocaine was used for local anesthesia. Under ultrasound guidance a 8 Fr Safe-T-Centesis catheter was introduced. Thoracentesis was performed. The catheter was removed and a dressing applied. FINDINGS: A total of approximately 920 mL of clear red fluid was removed. Samples were sent to the laboratory as requested by the clinical team. IMPRESSION: Successful ultrasound guided right thoracentesis yielding 920 mL of pleural fluid. Performed By Theresa Mulligan, PA-C Electronically Signed   By: Gilmer Mor D.O.   On: 05/28/2023 14:31   DG Chest 2 View Result Date: 05/28/2023 CLINICAL DATA:  Pleural effusion. EXAM: CHEST - 2 VIEW COMPARISON:  May 25, 2023. FINDINGS: Stable cardiomediastinal silhouette. Stable bilateral pleural effusions with associated bibasilar atelectasis. Bony thorax is unremarkable. IMPRESSION: Stable bilateral pleural effusions with associated bibasilar atelectasis. Electronically Signed   By: Lupita Raider M.D.   On: 05/28/2023 09:50   MR PELVIS WO CONTRAST Result Date: 05/26/2023 CLINICAL DATA:  Uterine mass  (fibroid versus sarcoma), concern for metastatic disease EXAM: MRI PELVIS WITHOUT CONTRAST TECHNIQUE: Multiplanar multisequence MR imaging of the pelvis was performed. No intravenous contrast was administered. COMPARISON:  CT abdomen/pelvis dated 05/25/2023. FINDINGS: Urinary Tract:  Bladder is underdistended but unremarkable. Bowel:  Visualized bowel is grossly unremarkable. Vascular/Lymphatic: No evidence of aneurysm. Small bilateral pelvic sidewall nodes measuring up to 10-11 mm short axis (series 4/image 25), suspicious. Reproductive: Uterus is notable for multiple uterine fibroids (series 4/images 21, 24, and 26), including a dominant 13.3 x 11.4 x 11.8 cm intramural fibroid in the left anterior uterine body. Suspected right ovary is within normal limits (series 4/image 19). Left ovary is not discretely visualized. Other:  Moderate pelvic ascites. Suspected abnormal peritoneal soft tissue/omental caking beneath the anterior abdominal wall (series 7/images 3 and 5), mostly excluded on this pelvic MR. Musculoskeletal: No focal osseous lesions. IMPRESSION: Dominant 13.3 cm intramural fibroid in the left anterior uterine body. While uterine leiomyosarcoma cannot be excluded by imaging, the lesion does not demonstrate suspicious imaging findings by MR. Moderate pelvic ascites with suspected peritoneal disease/omental caking beneath the anterior abdominal wall, incompletely visualized. Mild pelvic lymphadenopathy, suspicious for nodal metastases. Assuming this does in fact reflect a benign fibroid, the underlying site of primary malignancy is not evident on this study. Electronically Signed   By: Charline Bills M.D.   On: 05/26/2023 22:08   VAS [place] LOWER EXTREMITY VENOUS (DVT) Result Date: 05/26/2023  Lower Venous DVT Study Patient Name:  Sharon Hensley  Date of Exam:   05/26/2023 Medical Rec #: [id number]   Accession #:    [id number] Date of Birth: 11/26/1965   Patient Gender: F Patient Age:   75 years Exam Location[facility]   [facility] Procedure:      VAS [place] LOWER EXTREMITY VENOUS (DVT) Referring Phys: A POWELL JR --------------------------------------------------------------------------------  Indications: Edema, and pulmonary embolism.  Risk Factors: Confirmed PE. Anticoagulation: Heparin. Comparison Study: No prior studies. Performing Technologist: Chanda Busing RVT  Examination Guidelines: A complete evaluation includes B-mode imaging, spectral Doppler, color Doppler, and power Doppler as  needed of all accessible portions of each vessel. Bilateral testing is considered an integral part of a complete examination. Limited examinations for reoccurring indications may be performed as noted. The reflux portion of the exam is performed with the patient in reverse Trendelenburg.  +---------+---------------+---------+-----------+----------+--------------+ RIGHT    CompressibilityPhasicitySpontaneityPropertiesThrombus Aging +---------+---------------+---------+-----------+----------+--------------+ CFV      Full           Yes      Yes                                 +---------+---------------+---------+-----------+----------+--------------+ SFJ      Full                                                        +---------+---------------+---------+-----------+----------+--------------+ FV Prox  Full                                                        +---------+---------------+---------+-----------+----------+--------------+ FV Mid   Full                                                        +---------+---------------+---------+-----------+----------+--------------+ FV DistalPartial        Yes      Yes                  Acute          +---------+---------------+---------+-----------+----------+--------------+ PFV      Full                                                        +---------+---------------+---------+-----------+----------+--------------+ POP      Partial        Yes       Yes                  Acute          +---------+---------------+---------+-----------+----------+--------------+ PTV      None                                         Acute          +---------+---------------+---------+-----------+----------+--------------+ PERO     Partial                                      Acute          +---------+---------------+---------+-----------+----------+--------------+ Gastroc  None  Acute          +---------+---------------+---------+-----------+----------+--------------+ SSV      None                                         Acute          +---------+---------------+---------+-----------+----------+--------------+   +---------+---------------+---------+-----------+----------+--------------+ LEFT     CompressibilityPhasicitySpontaneityPropertiesThrombus Aging +---------+---------------+---------+-----------+----------+--------------+ CFV      Full           Yes      Yes                                 +---------+---------------+---------+-----------+----------+--------------+ SFJ      Full                                                        +---------+---------------+---------+-----------+----------+--------------+ FV Prox  Full                                                        +---------+---------------+---------+-----------+----------+--------------+ FV Mid   Full                                                        +---------+---------------+---------+-----------+----------+--------------+ FV DistalFull                                                        +---------+---------------+---------+-----------+----------+--------------+ PFV      Full                                                        +---------+---------------+---------+-----------+----------+--------------+ POP      None           No       No                   Acute           +---------+---------------+---------+-----------+----------+--------------+ PTV      None                                         Acute          +---------+---------------+---------+-----------+----------+--------------+ PERO     None  Acute          +---------+---------------+---------+-----------+----------+--------------+ Gastroc  None                                         Acute          +---------+---------------+---------+-----------+----------+--------------+ SSV      None                                         Acute          +---------+---------------+---------+-----------+----------+--------------+     Summary: RIGHT: - Findings consistent with acute deep vein thrombosis involving the right femoral vein, right popliteal vein, right posterior tibial veins, and right peroneal veins. - Findings consistent with acute superficial vein thrombosis involving the right small saphenous vein. Findings consistent with acute intramuscular thrombosis involving the right gastrocnemius veins. - No cystic structure found in the popliteal fossa.  LEFT: - Findings consistent with acute deep vein thrombosis involving the left popliteal vein, left posterior tibial veins, and left peroneal veins. - Findings consistent with acute superficial vein thrombosis involving the left small saphenous vein. Findings consistent with acute intramuscular thrombosis involving the left gastrocnemius veins. - No cystic structure found in the popliteal fossa.  *See table(s) above for measurements and observations. Electronically signed by Lemar Livings MD on 05/26/2023 at 3:29:25 PM.    Final    ECHOCARDIOGRAM COMPLETE Result Date: 05/26/2023    ECHOCARDIOGRAM REPORT   Patient Name:   Sharon Hensley Date of Exam: 05/26/2023 Medical Rec #:  [id number]  Height:       64.0 in Accession #:    [id number] Weight:       209.0 lb Date of Birth:  [birth year]  BSA:          1.993 m Patient Age:     57 years   BP:           122/70 mmHg Patient Gender: F          HR:           94 bpm. Exam Location:  Inpatient Procedure: 2D Echo, Color Doppler and Cardiac Doppler (Both Spectral and Color            Flow Doppler were utilized during procedure). Indications:    Pulmonary Embolus I26.09  History:        Patient has no prior history of Echocardiogram examinations.  Sonographer:    Harriette Bouillon RDCS Referring Phys: [phone] A CALDWELL POWELL JR IMPRESSIONS  1. Left ventricular ejection fraction, by estimation, is 60 to 65%. The left ventricle has normal function. The left ventricle has no regional wall motion abnormalities. There is mild concentric left ventricular hypertrophy. Left ventricular diastolic parameters are consistent with Grade I diastolic dysfunction (impaired relaxation).  2. Right ventricular systolic function is normal. The right ventricular size is normal. Tricuspid regurgitation signal is inadequate for assessing PA pressure.  3. The mitral valve is normal in structure. No evidence of mitral valve regurgitation. No evidence of mitral stenosis.  4. The aortic valve is tricuspid. Aortic valve regurgitation is not visualized. No aortic stenosis is present.  5. The inferior vena cava is normal in size with greater than 50% respiratory variability, suggesting right atrial pressure of 3 mmHg.  6. Left pleural effusion present. FINDINGS  Left Ventricle: Left ventricular ejection fraction, by estimation, is 60 to 65%. The left ventricle has normal function. The left ventricle has no regional wall motion abnormalities. Strain imaging was not performed. The left ventricular internal cavity  size was normal in size. There is mild concentric left ventricular hypertrophy. Left ventricular diastolic parameters are consistent with Grade I diastolic dysfunction (impaired relaxation). Right Ventricle: The right ventricular size is normal. No increase in right ventricular wall thickness. Right ventricular systolic  function is normal. Tricuspid regurgitation signal is inadequate for assessing PA pressure. Left [facility]: Left atrial size was normal in size. Right [facility]: Right atrial size was normal in size. Pericardium: Left pleural effusion present. There is no evidence of pericardial effusion. Mitral Valve: The mitral valve is normal in structure. No evidence of mitral valve regurgitation. No evidence of mitral valve stenosis. Tricuspid Valve: The tricuspid valve is normal in structure. Tricuspid valve regurgitation is not demonstrated. Aortic Valve: The aortic valve is tricuspid. Aortic valve regurgitation is not visualized. No aortic stenosis is present. Pulmonic Valve: The pulmonic valve was normal in structure. Pulmonic valve regurgitation is not visualized. Aorta: The aortic root is normal in size and structure. Venous: The inferior vena cava is normal in size with greater than 50% respiratory variability, suggesting right atrial pressure of 3 mmHg. IAS/Shunts: No atrial level shunt detected by color flow Doppler. Additional Comments: 3D imaging was not performed.  LEFT VENTRICLE PLAX 2D LVIDd:         3.60 cm   Diastology LVIDs:         2.30 cm   LV e' medial:    4.68 cm/s LV PW:         1.20 cm   LV E/e' medial:  18.5 LV IVS:        1.20 cm   LV e' lateral:   9.25 cm/s LVOT diam:     2.00 cm   LV E/e' lateral: 9.3 LV SV:         67 LV SV Index:   34 LVOT Area:     3.14 cm  RIGHT VENTRICLE             IVC RV S prime:     10.80 cm/s  IVC diam: 1.60 cm TAPSE (M-mode): 1.7 cm LEFT ATRIUM             Index        RIGHT ATRIUM           Index LA diam:        2.60 cm 1.30 cm/m   RA Area:     13.40 cm LA Vol (A2C):   45.9 ml 23.03 ml/m  RA Volume:   32.00 ml  16.05 ml/m LA Vol (A4C):   32.3 ml 16.20 ml/m LA Biplane Vol: 41.5 ml 20.82 ml/m  AORTIC VALVE LVOT Vmax:   129.00 cm/s LVOT Vmean:  83.900 cm/s LVOT VTI:    0.213 m  AORTA Ao Root diam: 2.70 cm Ao Asc diam:  3.60 cm MITRAL VALVE MV Area (PHT): 6.54 cm     SHUNTS  MV Decel Time: 116 msec     Systemic VTI:  0.21 m MV E velocity: 86.40 cm/s   Systemic Diam: 2.00 cm MV A velocity: 114.00 cm/s MV E/A ratio:  0.76 Dalton McleanMD Electronically signed by Wilfred Lacy Signature Date/Time: 05/26/2023/12:50:45 PM    Final    CT Angio Chest PE W/Cm &/Or Wo Cm Result Date: 05/25/2023 CLINICAL DATA:  Intermittent shortness of breath for 2 weeks, left-sided chest pain EXAM: CT ANGIOGRAPHY CHEST WITH CONTRAST TECHNIQUE: Multidetector CT imaging of the chest was performed using the standard protocol during bolus administration of intravenous contrast. Multiplanar CT image reconstructions and MIPs were obtained to evaluate the vascular anatomy. RADIATION DOSE REDUCTION: This exam was performed according to the departmental dose-optimization program which includes automated exposure control, adjustment of the mA and/or kV according to patient size and/or use of iterative reconstruction technique. CONTRAST:  OMNIPAQUE IOHEXOL 350 MG/ML SOLN COMPARISON:  05/25/2023 FINDINGS: Cardiovascular: This is a technically adequate evaluation of the pulmonary vasculature. There are bilateral pulmonary emboli, primarily within the segmental vessels of the left upper, right upper, and right lower lobes. Mild to moderate clot burden. No evidence of right heart strain. The heart is unremarkable without pericardial effusion. No evidence of thoracic aortic aneurysm or dissection. Atherosclerosis of the aorta and coronary vasculature. Mediastinum/Nodes: Nonspecific subcentimeter lymph nodes throughout the mediastinum and hilar regions. Thyroid, trachea, and esophagus are grossly unremarkable. Lungs/Pleura: Moderate bilateral pleural effusions, right greater than left, with significant compressive atelectasis within the bilateral lower lobes. There is also complete consolidation with volume loss in the right middle lobe compatible with atelectasis. This is of uncertain chronicity. No evidence of  central obstructing mass. No airspace disease or pneumothorax.  Central airways are patent. Upper Abdomen: Small volume ascites.  Multiple hepatic cysts. Musculoskeletal: No acute or destructive bony abnormalities. Reconstructed images demonstrate no additional findings. Review of the MIP images confirms the above findings. IMPRESSION: 1. Bilateral segmental pulmonary emboli. Mild moderate clot burden with no evidence of right heart strain. 2. Moderate bilateral pleural effusions, with extensive compressive atelectasis throughout the lower lobes. 3. Complete atelectasis of the right middle lobe, of uncertain chronicity. No central obstructing lesion identified. 4. Numerous subcentimeter mediastinal and hilar lymph nodes, nonspecific. No pathologic adenopathy. 5. Upper abdominal ascites. 6. Aortic Atherosclerosis (ICD10-I70.0). Coronary artery atherosclerosis. Critical Value/emergent results were called by telephone at the time of interpretation on 05/25/2023 at 3:46 pm to provider [facility] , who verbally acknowledged these results. Electronically Signed   By: Sharlet Salina M.D.   On: 05/25/2023 15:54   CT ABDOMEN PELVIS W CONTRAST Result Date: 05/25/2023 CLINICAL DATA:  Abdominal pain, acute, nonlocalized. EXAM: CT ABDOMEN AND PELVIS WITH CONTRAST TECHNIQUE: Multidetector CT imaging of the abdomen and pelvis was performed using the standard protocol following bolus administration of intravenous contrast. RADIATION DOSE REDUCTION: This exam was performed according to the departmental dose-optimization program which includes automated exposure control, adjustment of the mA and/or kV according to patient size and/or use of iterative reconstruction technique. CONTRAST:  OMNIPAQUE IOHEXOL 350 MG/ML SOLN COMPARISON:  CT scan abdomen and pelvis from 04/01/2003. FINDINGS: Lower chest: Bilateral moderate to large pleural effusions noted with associated compressive atelectatic changes. The heart is normal in  size. No pericardial effusion. Hepatobiliary: The liver is normal in size. Non-cirrhotic configuration. There are at least 2 simple cysts in the liver with largest measuring up to 3.3 x 3.4 cm. The cysts are present since the prior study dating back to 2005 and exhibits significant interval growth. No intrahepatic or extrahepatic bile duct dilation. Gallbladder is surgically absent. Pancreas: Unremarkable. No pancreatic ductal dilatation or surrounding inflammatory changes. Spleen: Within normal limits. No focal lesion. Adrenals/Urinary Tract: Adrenal glands are unremarkable. No suspicious renal mass. No hydronephrosis. No renal or ureteric calculi. Urinary bladder is under distended, precluding optimal assessment. However, no large mass or stones identified. No  perivesical fat stranding. Stomach/Bowel: No disproportionate dilation of the small or large bowel loops. No evidence of abnormal bowel wall thickening or inflammatory changes. The appendix is unremarkable. Vascular/Lymphatic: There is small-to-moderate ascites mainly in the perihepatic/perisplenic region, dependent pelvis and bilateral paracolic gutters. There are soft tissue nodularity/stranding in the upper abdomen, which is nonspecific but can be seen with ascites or due to peritoneal carcinomatosis. No pneumoperitoneum. There are mildly enlarged retroperitoneal lymph nodes, which are indeterminate in etiology. No aneurysmal dilation of the major abdominal arteries. There are mild peripheral atherosclerotic vascular calcifications of the aorta and its major branches. Reproductive: The uterus is enlarged secondary to a markedly enlarged 12.9 x 13.7 cm heterogeneous hypoattenuating area in the fundus/uterine body region. The uterus is probably pushed to the right and inferiorly however, not well evaluated. Endometrium is not seen. Bilateral ovaries are not distinctly visualized on this exam however, no large adnexal mass seen. Other: There is a small fat  containing umbilical hernia. The soft tissues and abdominal wall are otherwise unremarkable. Musculoskeletal: No suspicious osseous lesions. There are mild multilevel degenerative changes in the visualized spine. IMPRESSION: 1. Enlarged uterus secondary to a large heterogeneous hypoattenuating area in the fundus/uterine body region. This is not well evaluated on the current exam. Differential diagnosis includes benign versus malignant uterine mass. Correlate clinically. Consider additional imaging as indicated. 2. There is small-to-moderate ascites. There is also soft tissue nodularity/stranding in the upper abdomen, which is nonspecific but can be seen with ascites or due to peritoneal carcinomatosis. 3. Bilateral moderate to large pleural effusions with associated compressive atelectatic changes. 4. Multiple other nonacute observations, as described above. Aortic Atherosclerosis (ICD10-I70.0). Electronically Signed   By: Jules Schick M.D.   On: 05/25/2023 15:44   DG Chest Port 1 View Result Date: 05/25/2023 CLINICAL DATA:  Shortness of breath. EXAM: PORTABLE CHEST 1 VIEW COMPARISON:  07/20/2013. FINDINGS: Low lung volume. Mild diffuse pulmonary vascular congestion, likely accentuated by low lung volume. No frank pulmonary edema. There are bilateral small-to-moderate pleural effusions with probable underlying compressive atelectatic changes. No pneumothorax. Normal cardio-mediastinal silhouette. No acute osseous abnormalities. The soft tissues are within normal limits. IMPRESSION: Findings concerning for congestive heart failure/pulmonary edema. Correlate clinically. Electronically Signed   By: Jules Schick M.D.   On: 05/25/2023 12:28

## 2023-06-06 ENCOUNTER — Other Ambulatory Visit: Payer: Self-pay

## 2023-06-07 ENCOUNTER — Other Ambulatory Visit: Payer: Self-pay

## 2023-06-08 ENCOUNTER — Encounter: Payer: Self-pay | Admitting: Oncology

## 2023-06-08 ENCOUNTER — Encounter: Payer: Self-pay | Admitting: Hematology and Oncology

## 2023-06-08 ENCOUNTER — Telehealth: Payer: Self-pay | Admitting: Oncology

## 2023-06-08 NOTE — Progress Notes (Signed)
 Requested P53, MMR and MSI on accession [id number] and [phone] with [facility] via email.

## 2023-06-08 NOTE — Telephone Encounter (Signed)
 Called Sharon Hensley and advised her of the port placement appointment on 06/12/23 with arrival to [facility] at 9:00.  Gave her instructions for NPO after midnight, can take AM meds with sips of water, she will need a driver to pick her up and someone to stay with her for 24 hours after.  She verbalized understanding and agreement of the appointment and instructions.   Also asked if she would like to schedule a telephone appointment today with Dr. Alvester Morin to discuss the plan.  [name] said she is ok with the plan and does not need an appointment at this time.

## 2023-06-09 ENCOUNTER — Other Ambulatory Visit: Payer: Self-pay

## 2023-06-10 ENCOUNTER — Other Ambulatory Visit: Payer: Self-pay | Admitting: Hematology and Oncology

## 2023-06-10 ENCOUNTER — Encounter: Payer: Self-pay | Admitting: Hematology and Oncology

## 2023-06-10 ENCOUNTER — Encounter: Payer: Self-pay | Admitting: Oncology

## 2023-06-10 DIAGNOSIS — C55 Malignant neoplasm of uterus, part unspecified: Secondary | ICD-10-CM

## 2023-06-10 DIAGNOSIS — J90 Pleural effusion, not elsewhere classified: Secondary | ICD-10-CM

## 2023-06-10 LAB — CYTOLOGY - NON PAP

## 2023-06-11 ENCOUNTER — Telehealth: Payer: Self-pay | Admitting: Oncology

## 2023-06-11 ENCOUNTER — Inpatient Hospital Stay

## 2023-06-11 ENCOUNTER — Other Ambulatory Visit: Payer: Self-pay | Admitting: Student

## 2023-06-11 ENCOUNTER — Encounter: Payer: Self-pay | Admitting: Hematology and Oncology

## 2023-06-11 ENCOUNTER — Inpatient Hospital Stay: Admitting: Hematology and Oncology

## 2023-06-11 VITALS — BP 140/86 | HR 103 | Resp 18 | Ht 64.0 in | Wt 202.4 lb

## 2023-06-11 DIAGNOSIS — B009 Herpesviral infection, unspecified: Secondary | ICD-10-CM

## 2023-06-11 DIAGNOSIS — C55 Malignant neoplasm of uterus, part unspecified: Secondary | ICD-10-CM | POA: Diagnosis not present

## 2023-06-11 DIAGNOSIS — J90 Pleural effusion, not elsewhere classified: Secondary | ICD-10-CM | POA: Diagnosis not present

## 2023-06-11 DIAGNOSIS — I2699 Other pulmonary embolism without acute cor pulmonale: Secondary | ICD-10-CM

## 2023-06-11 DIAGNOSIS — Z01818 Encounter for other preprocedural examination: Secondary | ICD-10-CM

## 2023-06-11 LAB — CBC WITH DIFFERENTIAL (CANCER CENTER ONLY)
Abs Immature Granulocytes: 0.04 10*3/uL (ref 0.00–0.07)
Basophils Absolute: 0 10*3/uL (ref 0.0–0.1)
Basophils Relative: 0 %
Eosinophils Absolute: 0.1 10*3/uL (ref 0.0–0.5)
Eosinophils Relative: 1 %
HCT: 46.3 % — ABNORMAL HIGH (ref 36.0–46.0)
Hemoglobin: 15.2 g/dL — ABNORMAL HIGH (ref 12.0–15.0)
Immature Granulocytes: 0 %
Lymphocytes Relative: 11 %
Lymphs Abs: 1.2 10*3/uL (ref 0.7–4.0)
MCH: 29 pg (ref 26.0–34.0)
MCHC: 32.8 g/dL (ref 30.0–36.0)
MCV: 88.2 fL (ref 80.0–100.0)
Monocytes Absolute: 0.8 10*3/uL (ref 0.1–1.0)
Monocytes Relative: 7 %
Neutro Abs: 9.4 10*3/uL — ABNORMAL HIGH (ref 1.7–7.7)
Neutrophils Relative %: 81 %
Platelet Count: 303 10*3/uL (ref 150–400)
RBC: 5.25 MIL/uL — ABNORMAL HIGH (ref 3.87–5.11)
RDW: 13 % (ref 11.5–15.5)
WBC Count: 11.6 10*3/uL — ABNORMAL HIGH (ref 4.0–10.5)
nRBC: 0 % (ref 0.0–0.2)

## 2023-06-11 LAB — CMP (CANCER CENTER ONLY)
ALT: 16 U/L (ref 0–44)
AST: 14 U/L — ABNORMAL LOW (ref 15–41)
Albumin: 4.1 g/dL (ref 3.5–5.0)
Alkaline Phosphatase: 132 U/L — ABNORMAL HIGH (ref 38–126)
Anion gap: 7 (ref 5–15)
BUN: 21 mg/dL — ABNORMAL HIGH (ref 6–20)
CO2: 34 mmol/L — ABNORMAL HIGH (ref 22–32)
Calcium: 10.6 mg/dL — ABNORMAL HIGH (ref 8.9–10.3)
Chloride: 97 mmol/L — ABNORMAL LOW (ref 98–111)
Creatinine: 1.44 mg/dL — ABNORMAL HIGH (ref 0.44–1.00)
GFR, Estimated: 42 mL/min — ABNORMAL LOW (ref >60)
Glucose, Bld: 127 mg/dL — ABNORMAL HIGH (ref 70–99)
Potassium: 4.5 mmol/L (ref 3.5–5.1)
Sodium: 138 mmol/L (ref 135–145)
Total Bilirubin: 0.4 mg/dL (ref 0.0–1.2)
Total Protein: 7.6 g/dL (ref 6.5–8.1)

## 2023-06-11 LAB — T4, FREE: Free T4: 0.99 ng/dL (ref 0.61–1.12)

## 2023-06-11 MED ORDER — APIXABAN 5 MG PO TABS: 5.0000 mg | ORAL_TABLET | Freq: Two times a day (BID) | ORAL | Status: DC

## 2023-06-11 NOTE — Assessment & Plan Note (Addendum)
 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 They are negative for calretinin, WT1, D2-40, CK5/6 and ER. The findings are in keeping with an adenocarcinoma favoring from gynecologic system  She is not a candidate for upfront surgery due to diffuse disease and recent diagnosis of DVT and PE She is scheduled for port placement tomorrow  We reviewed the NCCN guidelines I recommend treatment based on publication as follows:  Dostarlimab for Primary Advanced or Recurrent Endometrial Cancer  [place] Maryclare Bean, M.D., Cherly Beach. Almeta Monas, M.D., Orion Crook. Slomovitz, M.D., Ren Doreene Adas, Ph.D., Sherri Rad, Ph.D., Doristine Locks, M.D., Lattie Haw, M.D., Aleda Grana, M.D., Sharlene Motts, M.D., Rosezella [place]. Buelah Manis, M.D., Ph.D., Clarise Cruz. Tora Kindred, M.D., Genia Plants, M.D., et al., for the [employer]*  Malva Limes Med [id number] DOI: 10.1056/[id number]  BACKGROUND Dostarlimab is an immune-checkpoint inhibitor that targets the programmed cell death 1 receptor. The combination of chemotherapy and immunotherapy may have synergistic effects in the treatment of endometrial cancer.  METHODS We conducted a phase 3, global, double-blind, randomized, placebo-controlled trial. Eligible patients with primary advanced stage III or IV or first recurrent endometrial cancer were randomly assigned in a 1:1 ratio to receive either dostarlimab (500 mg) or placebo, plus carboplatin (area under the concentration-time curve, 5 mg per milliliter per minute) and paclitaxel (175 mg per square meter of body-surface area), every 3 weeks (six cycles), followed by dostarlimab (1000 mg) or placebo every 6 weeks for up to 3 years. The primary end points were progression-free survival as assessed by the investigator according to  Response Evaluation Criteria in Solid Tumors (RECIST), version 1.1, and overall survival. Safety was also assessed.  RESULTS Of the 494 patients who underwent randomization, 118 (23.9%) had mismatch repair-deficient (dMMR), microsatellite instability-high (MSI-H) tumors. In the dMMR-MSI-H population, estimated progression-free survival at 24 months was 61.4% (95% confidence interval [CI], 46.3 to 73.4) in the dostarlimab group and 15.7% (95% CI, 7.2 to 27.0) in the placebo group (hazard ratio for progression or death, 0.28; 95% CI, 0.16 to 0.50; P<0.001). In the overall population, progression-free survival at 24 months was 36.1% (95% CI, 29.3 to 42.9) in the dostarlimab group and 18.1% (95% CI, 13.0 to 23.9) in the placebo group (hazard ratio, 0.64; 95% CI, 0.51 to 0.80; P<0.001). Overall survival at 24 months was 71.3% (95% CI, 64.5 to 77.1) with dostarlimab and 56.0% (95% CI, 48.9 to 62.5) with placebo (hazard ratio for death, 0.64; 95% CI, 0.46 to 0.87). The most common adverse events that occurred or worsened during treatment were nausea (53.9% of the patients in the dostarlimab group and 45.9% of those in the placebo group), alopecia (53.5% and 50.0%), and fatigue (51.9% and 54.5%). Severe and serious adverse events were more frequent in the dostarlimab group than in the placebo group.  CONCLUSIONS Dostarlimab plus carboplatin-paclitaxel significantly increased progression-free survival among patients with primary advanced or recurrent endometrial cancer, with a substantial benefit in the dMMR-MSI-H population. (Funded by [employer]; [employer].gov number, JWJ19147829)  The risks, benefits, side effects of treatment is discussed with the patient and she agreed to proceed with plan of care.  Her insurance is out of network I have written a letter to see if we can make an exception to her case I have also asked our navigator to see if she can be seen at [facility] as soon as possible

## 2023-06-11 NOTE — Telephone Encounter (Signed)
 Called Sharon Hensley and let her know that her insurance is out of network.  Advised her to call her insurance to find out who would be in network or if she can get an exception to be treated at [facility].  Sharon Hensley said she thinks [facility] may be in network but will call to see about an exception.

## 2023-06-11 NOTE — Telephone Encounter (Signed)
 Treatment exception letter faxed to [facility].  Per Misty Stanley (partner) decision will take 14 days.  In the mean time, they would like to start chemotherapy at [facility] and would also like a referral to [facility].  Also notified Tempie Donning, CSW to contact patient regarding applying for Medicaid.  She recommended going to [facility] to apply. Gave patient the address.

## 2023-06-11 NOTE — Assessment & Plan Note (Addendum)
 This is resolving She will complete her 10-day course of Valtrex

## 2023-06-11 NOTE — Assessment & Plan Note (Addendum)
 Examination today revealed minimal pleural effusion She is not symptomatic

## 2023-06-11 NOTE — Progress Notes (Signed)
 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 They are negative for calretinin, WT1, D2-40, CK5/6 and ER. The findings are in keeping with an adenocarcinoma favoring from gynecologic system  She is not a candidate for upfront surgery due to diffuse disease and recent diagnosis of DVT and PE She is scheduled for port placement tomorrow  We reviewed the NCCN guidelines I recommend treatment based on publication as follows:  Dostarlimab for Primary Advanced or Recurrent Endometrial Cancer  [place] Maryclare Bean, M.D., [place]ly [place]. Almeta Monas, M.D., Orion Crook. Slom[place], M.D., Ren Doreene Adas, Ph.D., Sherri Rad, Ph.D., Doristine Locks, M.D., Lattie Haw, M.D., Aleda Grana, M.D., Sharlene Motts, M.D., Rosezella [place]. Buelah Manis, M.D., Ph.D., Clarise Cruz. Tora Kindred, M.D., Genia Plants, M.D., et al., for the [employer]*  Malva Limes Med 2023(360) [phone] DOI: 10.1056/[id number]  BACKGROUND Dostarlimab is an immune-checkpoint inhibitor that targets the programmed cell death 1 receptor. The combination of chemotherapy and immunotherapy may have synergistic effects in the treatment of endometrial cancer.  METHODS We conducted a phase 3, global, double-blind, randomized, placebo-controlled trial. Eligible patients with primary advanced stage III or IV or first recurrent endometrial cancer were randomly assigned in a 1:1 ratio to receive either dostarlimab (500 mg) or placebo, plus carboplatin (area under the concentration-time curve, 5 mg per milliliter per minute) and paclitaxel (175 mg per square meter of body-surface area),  every 3 weeks (six cycles), followed by dostarlimab (1000 mg) or placebo every 6 weeks for up to 3 years. The primary end points were progression-free survival as assessed by the investigator according to Response Evaluation Criteria in Solid Tumors (RECIST), version 1.1, and overall survival. Safety was also assessed.  RESULTS Of the 494 patients who underwent randomization, 118 (23.9%) had mismatch repair-deficient (dMMR), microsatellite instability-high (MSI-H) tumors. In the dMMR-MSI-H population, estimated progression-free survival at 24 months was 61.4% (95% confidence interval [CI], 46.3 to 73.4) in the dostarlimab group and 15.7% (95% CI, 7.2 to 27.0) in the placebo group (hazard ratio for progression or death, 0.28; 95% CI, 0.16 to 0.50; P<0.001). In the overall population, progression-free survival at 24 months was 36.1% (95% CI, 29.3 to 42.9) in the dostarlimab group and 18.1% (95% CI, 13.0 to 23.9) in the placebo group (hazard ratio, 0.64; 95% CI, 0.51 to 0.80; P<0.001). Overall survival at 24 months was 71.3% (95% CI, 64.5 to 77.1) with dostarlimab and 56.0% (95% CI, 48.9 to 62.5) with placebo (hazard ratio for death, 0.64; 95% CI, 0.46 to 0.87). The most common adverse events that occurred or worsened during treatment were nausea (53.9% of the patients in the dostarlimab group and 45.9% of those in the placebo group), alopecia (53.5% and 50.0%), and fatigue (51.9% and 54.5%). Severe and serious adverse events were more frequent in the dostarlimab group than in the placebo group.  CONCLUSIONS Dostarlimab plus carboplatin-paclitaxel significantly increased progression-free survival among patients with primary advanced or recurrent endometrial cancer, with a substantial benefit in the dMMR-MSI-H population. (Funded by [employer]; [employer].gov number, [id number])  The risks, benefits, side effects of treatment is discussed with the patient and she agreed to proceed with plan of care.  Her  insurance is out of network I have written  a letter to see if we can make an exception to her case I have also asked our navigator to see if she can be seen at [facility] as soon as possible Herpes simplex This is resolving She will complete her 10-day course of Valtrex Bilateral pulmonary embolism (HCC) We will work on payment assistance for Eliquis in the future Bilateral pleural effusion Examination today revealed minimal pleural effusion She is not symptomatic  No orders of the defined types were placed in this encounter.    Artis Delay, MD  INTERVAL HISTORY: she returns for return visit prior to chemotherapy Since her last time I saw her, she is doing better She has occasional vaginal spotting Her herpes simplex infection is resolving She denies recent constipation We just found out today that her insurance is out of network and we discussed logistic issues We spent majority of time reviewing risk, benefits, side effects of treatment  PHYSICAL EXAMINATION: ECOG PERFORMANCE STATUS: 1 - Symptomatic but completely ambulatory  Vitals:   06/11/23 1417  BP: (!) 140/86  Pulse: (!) 103  Resp: 18  SpO2: 96%   Filed Weights   06/11/23 1417  Weight: 202 lb 6.4 oz (91.8 kg)   Examination today revealed minimal pleural effusion bilaterally.  She has significant abdominal distention consistent with presence of ascites Relevant data reviewed during this visit included CBC and CMP

## 2023-06-11 NOTE — Telephone Encounter (Signed)
 Called [facility]cology and spoke to [place].  Advised patient is being referred urgently because her insurance is out of network with [facility].  Selena Batten will schedule the patient with Gyn Oncology and Medical Oncology and will call the patient with appointments.

## 2023-06-11 NOTE — Assessment & Plan Note (Addendum)
 We will work on payment assistance for Eliquis in the future

## 2023-06-12 ENCOUNTER — Telehealth: Payer: Self-pay

## 2023-06-12 ENCOUNTER — Other Ambulatory Visit: Payer: Self-pay

## 2023-06-12 ENCOUNTER — Other Ambulatory Visit: Payer: Self-pay | Admitting: Hematology and Oncology

## 2023-06-12 ENCOUNTER — Ambulatory Visit (HOSPITAL_COMMUNITY)
Admission: RE | Admit: 2023-06-12 | Discharge: 2023-06-12 | Disposition: A | Discharge status: Home / Self Care | Source: Ambulatory Visit | Attending: Hematology and Oncology | Admitting: Hematology and Oncology

## 2023-06-12 DIAGNOSIS — C55 Malignant neoplasm of uterus, part unspecified: Secondary | ICD-10-CM

## 2023-06-12 LAB — CA 125: Cancer Antigen (CA) 125: 1218 U/mL — ABNORMAL HIGH (ref 0.0–38.1)

## 2023-06-12 LAB — TSH: TSH: 9.685 u[IU]/mL — ABNORMAL HIGH (ref 0.350–4.500)

## 2023-06-12 MED ORDER — LEVOTHYROXINE SODIUM 175 MCG PO TABS: 175.0000 ug | ORAL_TABLET | ORAL | 3 refills | Status: DC

## 2023-06-12 NOTE — Telephone Encounter (Signed)
 She is canceling port also?

## 2023-06-12 NOTE — Telephone Encounter (Signed)
 Called back and given message to [place]. She verbalized understanding regarding Synthroid.  They are canceling all appts at [facility]. Appts canceled. They have appt at [facility] 3/25 with oncology and insurance will pay for her to go to [facility]. They will call the office back for questions.

## 2023-06-12 NOTE — Telephone Encounter (Signed)
-----   Message from Artis Delay sent at 06/12/2023  9:36 AM EDT ----- TSH came back high I recommend increasing synthroid to 175 mcg daily, sent to [facility]

## 2023-06-12 NOTE — Telephone Encounter (Signed)
 Called and left a message for Cernig[place]-Ann and Misty Stanley asking them to call the office back. Left below message and told Rx sent to [facility].

## 2023-06-16 ENCOUNTER — Ambulatory Visit

## 2023-06-22 LAB — MOLECULAR PATHOLOGY

## 2023-06-23 ENCOUNTER — Ambulatory Visit: Admitting: Hematology and Oncology

## 2023-07-07 ENCOUNTER — Other Ambulatory Visit

## 2023-07-07 ENCOUNTER — Ambulatory Visit: Admitting: Hematology and Oncology

## 2023-07-07 ENCOUNTER — Ambulatory Visit

## 2023-08-12 ENCOUNTER — Telehealth: Payer: Self-pay

## 2023-08-12 NOTE — Telephone Encounter (Signed)
 Received call from [facility] [employer] stating pt passed away in her home Aug 12, 2023 and they are needing Dr Marton Sleeper to sign her death certificate before they can move forward with [facility]mation. Message forwarded to MD.

## 2023-08-30 DEATH — deceased
